# Patient Record
Sex: Female | Born: 1970
Health system: Southern US, Community
[De-identification: ages and names within clinical notes are randomized; demographics above are authoritative.]

## PROBLEM LIST (undated history)

## (undated) DIAGNOSIS — T7840XA Allergy, unspecified, initial encounter: Secondary | ICD-10-CM

## (undated) DIAGNOSIS — T4145XA Adverse effect of unspecified anesthetic, initial encounter: Secondary | ICD-10-CM

## (undated) DIAGNOSIS — D6851 Activated protein C resistance: Secondary | ICD-10-CM

## (undated) DIAGNOSIS — F419 Anxiety disorder, unspecified: Secondary | ICD-10-CM

## (undated) DIAGNOSIS — R0602 Shortness of breath: Secondary | ICD-10-CM

## (undated) DIAGNOSIS — R21 Rash and other nonspecific skin eruption: Secondary | ICD-10-CM

## (undated) DIAGNOSIS — N390 Urinary tract infection, site not specified: Secondary | ICD-10-CM

## (undated) DIAGNOSIS — N189 Chronic kidney disease, unspecified: Secondary | ICD-10-CM

## (undated) HISTORY — PX: OTHER SURGICAL HISTORY: SHX169

## (undated) HISTORY — DX: Allergy, unspecified, initial encounter: T78.40XA

## (undated) HISTORY — DX: Activated protein C resistance: D68.51

## (undated) HISTORY — PX: TUBAL LIGATION: SHX77

## (undated) HISTORY — PX: LITHOTRIPSY: SUR834

---

## 1997-12-01 ENCOUNTER — Observation Stay (HOSPITAL_COMMUNITY): Admission: EM | Admit: 1997-12-01 | Discharge: 1997-12-02 | Payer: Self-pay | Admitting: Emergency Medicine

## 1998-03-25 ENCOUNTER — Other Ambulatory Visit: Admission: RE | Admit: 1998-03-25 | Discharge: 1998-03-25 | Payer: Self-pay

## 1998-07-05 ENCOUNTER — Ambulatory Visit (HOSPITAL_COMMUNITY): Admission: RE | Admit: 1998-07-05 | Discharge: 1998-07-05 | Payer: Self-pay | Admitting: *Deleted

## 1998-07-05 ENCOUNTER — Encounter: Payer: Self-pay | Admitting: *Deleted

## 1998-07-07 ENCOUNTER — Encounter: Payer: Self-pay | Admitting: *Deleted

## 1998-07-07 ENCOUNTER — Ambulatory Visit (HOSPITAL_COMMUNITY): Admission: RE | Admit: 1998-07-07 | Discharge: 1998-07-07 | Payer: Self-pay | Admitting: *Deleted

## 1998-08-08 ENCOUNTER — Encounter: Payer: Self-pay | Admitting: Family Medicine

## 1998-08-08 ENCOUNTER — Encounter: Payer: Self-pay | Admitting: *Deleted

## 1998-08-08 ENCOUNTER — Observation Stay (HOSPITAL_COMMUNITY): Admission: AD | Admit: 1998-08-08 | Discharge: 1998-08-09 | Payer: Self-pay | Admitting: *Deleted

## 1998-08-10 ENCOUNTER — Inpatient Hospital Stay (HOSPITAL_COMMUNITY): Admission: AD | Admit: 1998-08-10 | Discharge: 1998-08-10 | Payer: Self-pay | Admitting: *Deleted

## 1998-08-15 ENCOUNTER — Encounter: Payer: Self-pay | Admitting: Family Medicine

## 1998-08-15 ENCOUNTER — Inpatient Hospital Stay (HOSPITAL_COMMUNITY): Admission: AD | Admit: 1998-08-15 | Discharge: 1998-08-19 | Payer: Self-pay | Admitting: Family Medicine

## 1998-08-16 ENCOUNTER — Encounter: Payer: Self-pay | Admitting: Family Medicine

## 1998-08-16 ENCOUNTER — Encounter: Payer: Self-pay | Admitting: *Deleted

## 1998-08-18 ENCOUNTER — Encounter: Payer: Self-pay | Admitting: Obstetrics & Gynecology

## 1998-08-18 ENCOUNTER — Encounter: Payer: Self-pay | Admitting: Obstetrics

## 1998-08-19 ENCOUNTER — Encounter: Payer: Self-pay | Admitting: Obstetrics

## 1998-11-08 ENCOUNTER — Ambulatory Visit (HOSPITAL_COMMUNITY): Admission: RE | Admit: 1998-11-08 | Discharge: 1998-11-08 | Payer: Self-pay | Admitting: Urology

## 1998-11-08 ENCOUNTER — Encounter: Payer: Self-pay | Admitting: Urology

## 1998-11-12 ENCOUNTER — Inpatient Hospital Stay (HOSPITAL_COMMUNITY): Admission: EM | Admit: 1998-11-12 | Discharge: 1998-11-13 | Payer: Self-pay | Admitting: *Deleted

## 1998-11-21 ENCOUNTER — Encounter: Payer: Self-pay | Admitting: Urology

## 1998-11-21 ENCOUNTER — Ambulatory Visit (HOSPITAL_COMMUNITY): Admission: RE | Admit: 1998-11-21 | Discharge: 1998-11-21 | Payer: Self-pay | Admitting: Urology

## 1998-12-26 ENCOUNTER — Other Ambulatory Visit: Admission: RE | Admit: 1998-12-26 | Discharge: 1998-12-26 | Payer: Self-pay | Admitting: Family Medicine

## 1999-02-23 ENCOUNTER — Other Ambulatory Visit: Admission: RE | Admit: 1999-02-23 | Discharge: 1999-02-23 | Payer: Self-pay | Admitting: Obstetrics and Gynecology

## 1999-08-02 ENCOUNTER — Other Ambulatory Visit: Admission: RE | Admit: 1999-08-02 | Discharge: 1999-08-02 | Payer: Self-pay | Admitting: Family Medicine

## 1999-12-29 ENCOUNTER — Encounter: Payer: Self-pay | Admitting: Family Medicine

## 1999-12-29 ENCOUNTER — Ambulatory Visit (HOSPITAL_COMMUNITY): Admission: RE | Admit: 1999-12-29 | Discharge: 1999-12-29 | Payer: Self-pay | Admitting: Family Medicine

## 2000-01-29 ENCOUNTER — Other Ambulatory Visit: Admission: RE | Admit: 2000-01-29 | Discharge: 2000-01-29 | Payer: Self-pay | Admitting: Gynecology

## 2000-03-19 ENCOUNTER — Inpatient Hospital Stay (HOSPITAL_COMMUNITY): Admission: AD | Admit: 2000-03-19 | Discharge: 2000-03-19 | Payer: Self-pay | Admitting: *Deleted

## 2000-05-07 ENCOUNTER — Emergency Department (HOSPITAL_COMMUNITY): Admission: EM | Admit: 2000-05-07 | Discharge: 2000-05-08 | Payer: Self-pay | Admitting: Emergency Medicine

## 2000-05-08 ENCOUNTER — Encounter: Payer: Self-pay | Admitting: Urology

## 2000-05-08 ENCOUNTER — Encounter: Admission: RE | Admit: 2000-05-08 | Discharge: 2000-05-08 | Payer: Self-pay | Admitting: Obstetrics and Gynecology

## 2000-07-29 ENCOUNTER — Inpatient Hospital Stay (HOSPITAL_COMMUNITY): Admission: AD | Admit: 2000-07-29 | Discharge: 2000-08-01 | Payer: Self-pay | Admitting: Gynecology

## 2000-09-09 ENCOUNTER — Other Ambulatory Visit: Admission: RE | Admit: 2000-09-09 | Discharge: 2000-09-09 | Payer: Self-pay | Admitting: Gynecology

## 2001-10-29 ENCOUNTER — Other Ambulatory Visit: Admission: RE | Admit: 2001-10-29 | Discharge: 2001-10-29 | Payer: Self-pay | Admitting: Family Medicine

## 2002-12-31 ENCOUNTER — Other Ambulatory Visit: Admission: RE | Admit: 2002-12-31 | Discharge: 2002-12-31 | Payer: Self-pay | Admitting: Family Medicine

## 2003-01-11 ENCOUNTER — Encounter: Admission: RE | Admit: 2003-01-11 | Discharge: 2003-01-11 | Payer: Self-pay | Admitting: Family Medicine

## 2004-03-16 ENCOUNTER — Other Ambulatory Visit: Admission: RE | Admit: 2004-03-16 | Discharge: 2004-03-16 | Payer: Self-pay | Admitting: Family Medicine

## 2005-06-27 ENCOUNTER — Encounter: Payer: Self-pay | Admitting: *Deleted

## 2005-06-27 ENCOUNTER — Encounter: Payer: Self-pay | Admitting: Urology

## 2005-06-27 ENCOUNTER — Encounter: Payer: Self-pay | Admitting: Emergency Medicine

## 2005-07-19 ENCOUNTER — Other Ambulatory Visit: Admission: RE | Admit: 2005-07-19 | Discharge: 2005-07-19 | Payer: Self-pay | Admitting: Family Medicine

## 2010-08-08 ENCOUNTER — Inpatient Hospital Stay (HOSPITAL_COMMUNITY)
Admission: AD | Admit: 2010-08-08 | Discharge: 2010-08-08 | Disposition: A | Discharge status: Home / Self Care | Payer: PRIVATE HEALTH INSURANCE | Source: Ambulatory Visit | Attending: Obstetrics & Gynecology | Admitting: Obstetrics & Gynecology

## 2010-08-08 DIAGNOSIS — O9989 Other specified diseases and conditions complicating pregnancy, childbirth and the puerperium: Secondary | ICD-10-CM

## 2010-08-08 DIAGNOSIS — R300 Dysuria: Secondary | ICD-10-CM | POA: Insufficient documentation

## 2010-08-08 DIAGNOSIS — O99891 Other specified diseases and conditions complicating pregnancy: Secondary | ICD-10-CM | POA: Insufficient documentation

## 2010-08-08 LAB — URINE MICROSCOPIC-ADD ON

## 2010-08-08 LAB — URINALYSIS, ROUTINE W REFLEX MICROSCOPIC
Bilirubin Urine: NEGATIVE
Glucose, UA: NEGATIVE mg/dL
Ketones, ur: NEGATIVE mg/dL
Nitrite: NEGATIVE
Protein, ur: NEGATIVE mg/dL
Specific Gravity, Urine: >1.03 — ABNORMAL HIGH (ref 1.005–1.030)
Urobilinogen, UA: 0.2 mg/dL (ref 0.0–1.0)
pH: 5.5 (ref 5.0–8.0)

## 2010-08-08 LAB — POCT PREGNANCY, URINE: Preg Test, Ur: POSITIVE

## 2010-08-10 LAB — URINE CULTURE
Colony Count: >=100000
Culture  Setup Time: 201206130516

## 2010-08-14 LAB — ANTIBODY SCREEN: Antibody Screen: NEGATIVE

## 2010-08-14 LAB — GC/CHLAMYDIA PROBE AMP, GENITAL
Chlamydia: NEGATIVE
Gonorrhea: NEGATIVE

## 2010-08-14 LAB — RUBELLA ANTIBODY, IGM: Rubella: IMMUNE

## 2010-08-14 LAB — ABO/RH: RH Type: POSITIVE

## 2010-08-14 LAB — HIV ANTIBODY (ROUTINE TESTING W REFLEX): HIV: NONREACTIVE

## 2010-08-14 LAB — RPR: RPR: NONREACTIVE

## 2010-08-14 LAB — HEPATITIS B SURFACE ANTIGEN: Hepatitis B Surface Ag: NEGATIVE

## 2011-01-15 ENCOUNTER — Encounter (HOSPITAL_COMMUNITY): Payer: Self-pay | Admitting: *Deleted

## 2011-01-15 ENCOUNTER — Inpatient Hospital Stay (HOSPITAL_COMMUNITY)
Admission: AD | Admit: 2011-01-15 | Discharge: 2011-01-15 | Disposition: A | Discharge status: Home / Self Care | Payer: PRIVATE HEALTH INSURANCE | Source: Ambulatory Visit | Attending: Obstetrics and Gynecology | Admitting: Obstetrics and Gynecology

## 2011-01-15 DIAGNOSIS — O99891 Other specified diseases and conditions complicating pregnancy: Secondary | ICD-10-CM | POA: Insufficient documentation

## 2011-01-15 DIAGNOSIS — R0602 Shortness of breath: Secondary | ICD-10-CM

## 2011-01-15 DIAGNOSIS — Z3480 Encounter for supervision of other normal pregnancy, unspecified trimester: Secondary | ICD-10-CM

## 2011-01-15 HISTORY — DX: Chronic kidney disease, unspecified: N18.9

## 2011-01-15 HISTORY — DX: Shortness of breath: R06.02

## 2011-01-15 NOTE — Progress Notes (Signed)
Pt c/o SOB, took percocet @ 1930 and was SOB @ 2030.  Pt not in any resp distress, Resp 17 and even.

## 2011-01-15 NOTE — ED Provider Notes (Signed)
History   Pt presents today c/o SOB when she is supine. She states she has to stand up to "catch a deep breath." She denies CP, fever, abd pain, vag dc, bleeding, or any other sx at this time. She reports GFM.  Chief Complaint  Patient presents with  . Shortness of Breath   HPI  OB History    Grav Para Term Preterm Abortions TAB SAB Ect Mult Living   3 2 1 1      2       Past Medical History  Diagnosis Date  . Chronic kidney disease   . Shortness of breath   . Depression     Past Surgical History  Procedure Date  . Cesarean section   . Colapsed lung   . Kidney stint   . Lithotripsy     Family History  Problem Relation Age of Onset  . Hypertension Mother   . Arthritis Father   . Diabetes Father   . Hypertension Maternal Grandmother     History  Substance Use Topics  . Smoking status: Never Smoker   . Smokeless tobacco: Not on file  . Alcohol Use: No    Allergies:  Allergies  Allergen Reactions  . Demerol Itching    Prescriptions prior to admission  Medication Sig Dispense Refill  . cyclobenzaprine (FLEXERIL) 5 MG tablet Take 5 mg by mouth 3 (three) times daily as needed. For muscle pain       . diphenhydrAMINE (BENADRYL) 25 MG tablet Take 25 mg by mouth 2 (two) times daily as needed. For congestion or allergies       . nitrofurantoin (MACRODANTIN) 50 MG capsule Take 50 mg by mouth daily.        Marland Kitchen oxyCODONE-acetaminophen (TYLOX) 5-500 MG per capsule Take 1 capsule by mouth every 4 (four) hours as needed. For pain       . prenatal vitamin w/FE, FA (PRENATAL 1 + 1) 27-1 MG TABS Take 1 tablet by mouth daily.          Review of Systems  Constitutional: Negative for fever.  Respiratory: Positive for shortness of breath. Negative for sputum production and wheezing.   Cardiovascular: Negative for chest pain.  Gastrointestinal: Negative for nausea, vomiting, abdominal pain, diarrhea and constipation.  Genitourinary: Negative for dysuria, urgency, frequency,  hematuria and flank pain.  Neurological: Negative for dizziness and headaches.  Psychiatric/Behavioral: Negative for depression and suicidal ideas.   Physical Exam   Blood pressure 121/65, pulse 93, temperature 98.4 F (36.9 C), temperature source Oral, resp. rate 16, height 5\' 7"  (1.702 m), weight 179 lb (81.194 kg), SpO2 99.00%.  Physical Exam  Nursing note and vitals reviewed. Constitutional: She is oriented to person, place, and time. She appears well-developed and well-nourished. No distress.  HENT:  Head: Normocephalic and atraumatic.  Eyes: EOM are normal. Pupils are equal, round, and reactive to light.  Cardiovascular: Normal rate and regular rhythm.  Exam reveals no gallop and no friction rub.   Murmur heard. Respiratory: Effort normal and breath sounds normal. No respiratory distress. She has no wheezes. She has no rales. She exhibits no tenderness.  GI: Soft. She exhibits no distension. There is no tenderness. There is no rebound and no guarding.  Neurological: She is alert and oriented to person, place, and time.  Skin: Skin is warm and dry. She is not diaphoretic.  Psychiatric: She has a normal mood and affect. Her behavior is normal. Judgment and thought content normal.  MAU Course  Procedures  O2 sat 99% on room air.  Discussed pt with Dr. Vincente Poli.  Assessment and Plan  SOB: discussed with pt at length. Advised pt to sleep at an incline. Discussed diet, activity, risks, and precautions. Reminded of [facility].  Clinton Gallant. Rice III, DrHSc, MPAS, PA-C  01/15/2011, 10:48 PM   Henrietta Hoover, PA 01/15/11 2252

## 2011-01-15 NOTE — Progress Notes (Signed)
Pt states she is short of breath when she lies down and has to stand up to take a deep breath--started at 2030-note she was seen today for a pinched nerve in her neck and given a script for percocet-seh took percocet at 1930 and the SOB started 1 hour later.

## 2011-02-16 ENCOUNTER — Encounter (HOSPITAL_COMMUNITY): Payer: Self-pay | Admitting: Pharmacist

## 2011-02-26 NOTE — H&P (Signed)
Valerie Cole  DICTATION # [id number] CSN# [id number]   Meriel Pica, MD 02/26/2011 10:13 AM

## 2011-02-28 NOTE — H&P (Signed)
NAMENORETTA, FRIER                 ACCOUNT NO.:  [id number]  MEDICAL RECORD NO.:  [id number]  LOCATION:                                 FACILITY:  PHYSICIAN:  Duke Salvia. Marcelle Overlie, M.D.DATE OF BIRTH:  January 27, 1971  DATE OF ADMISSION:  03/09/2011 DATE OF DISCHARGE:                             HISTORY & PHYSICAL   CHIEF COMPLAINT:  For repeat cesarean section at term.  HISTORY OF PRESENT ILLNESS:  A 41 year old, G3, P1-1-0-2, EDD January 18, presents for repeat cesarean section and tubal ligation.  This procedure including risks related to bleeding, infection, transfusion, adjacent organ injury, wound infection, phlebitis along with her expected recovery time, all reviewed with her which she understands and accepts.  In addition, the permanence of the tubal procedure failure rate of 2-3 per 1000 discussed which she also accepts.  This patient has had 2 previous cesarean section.  Has a child with spina bifida, early in the pregnancy. She has been on extra folic acid, seen Dr. Vonita Moss also for a history of renal stones.  Her first trimester screen AFP only were normal along with screening ultrasound normal.  She has been on extra iron.  She has also seen [facility] for a neck pain and has been diagnosed with cervical radiculopathy taking steroid dose pack x2 for relief along with Percocet p.r.n.  GBS was negative.  Last ultrasound February 22, 2011, EFW 6 pounds 3 ounces. AFI was normal, BPP 8/8.  PAST MEDICAL HISTORY:  Please see the [facility] form for details.  PHYSICAL EXAMINATION:  VITAL SIGNS:  Temp 98.2, blood pressure 130/84. HEENT:  Unremarkable. NECK:  Supple without masses. LUNGS:  Clear. CARDIOVASCULAR:  Regular rate and rhythm without murmurs, rubs or gallops. BREASTS:  Without masses.  Term fundal height.  The heart rate 140. Cervix was closed. EXTREMITIES:  Unremarkable. NEUROLOGIC:  Unremarkable.  IMPRESSION:  Term pregnancy, previous cesarean section x2.  PLAN:   Repeat cesarean section, tubal ligation.  Procedure and risks discussed as above.     Murriel Holwerda M. Marcelle Overlie, M.D.    RMH/MEDQ  D:  02/26/2011  T:  02/27/2011  Job:  [id number]

## 2011-03-01 ENCOUNTER — Encounter (HOSPITAL_COMMUNITY): Payer: Self-pay | Admitting: *Deleted

## 2011-03-01 NOTE — Progress Notes (Signed)
The date of RCS was moved up several days secondary to severe PUPPP rash, not responding to PO Medrol, AND severe kidney stone pain requiring PO Percocet.

## 2011-03-02 ENCOUNTER — Encounter (HOSPITAL_COMMUNITY): Payer: Self-pay

## 2011-03-02 ENCOUNTER — Encounter (HOSPITAL_COMMUNITY)
Admission: RE | Admit: 2011-03-02 | Discharge: 2011-03-02 | Disposition: A | Discharge status: Home / Self Care | Payer: PRIVATE HEALTH INSURANCE | Source: Ambulatory Visit | Attending: Obstetrics and Gynecology | Admitting: Obstetrics and Gynecology

## 2011-03-02 HISTORY — DX: Anxiety disorder, unspecified: F41.9

## 2011-03-02 HISTORY — DX: Urinary tract infection, site not specified: N39.0

## 2011-03-02 HISTORY — DX: Adverse effect of unspecified anesthetic, initial encounter: T41.45XA

## 2011-03-02 HISTORY — DX: Rash and other nonspecific skin eruption: R21

## 2011-03-02 LAB — CBC
HCT: 30.2 % — ABNORMAL LOW (ref 36.0–46.0)
Hemoglobin: 8.9 g/dL — ABNORMAL LOW (ref 12.0–15.0)
MCH: 23.9 pg — ABNORMAL LOW (ref 26.0–34.0)
MCHC: 29.5 g/dL — ABNORMAL LOW (ref 30.0–36.0)
MCV: 81.2 fL (ref 78.0–100.0)
Platelets: 208 10*3/uL (ref 150–400)
RBC: 3.72 MIL/uL — ABNORMAL LOW (ref 3.87–5.11)
RDW: 17.5 % — ABNORMAL HIGH (ref 11.5–15.5)
WBC: 10.5 10*3/uL (ref 4.0–10.5)

## 2011-03-02 LAB — SURGICAL PCR SCREEN
MRSA, PCR: NEGATIVE
Staphylococcus aureus: NEGATIVE

## 2011-03-02 LAB — RPR: RPR Ser Ql: NONREACTIVE

## 2011-03-02 NOTE — Patient Instructions (Addendum)
YOUR PROCEDURE IS SCHEDULED ON:03/06/11  ENTER THROUGH THE MAIN ENTRANCE OF [facility] AT:6am USE DESK PHONE AND DIAL [phone] TO INFORM us OF YOUR ARRIVAL  CALL [phone] IF YOU HAVE ANY QUESTIONS OR PROBLEMS PRIOR TO YOUR ARRIVAL.  REMEMBER: DO NOT EAT OR DRINK AFTER MIDNIGHT :Monday  SPECIAL INSTRUCTIONS:water is ok until 3:30 am  YOU MAY BRUSH YOUR TEETH THE MORNING OF SURGERY   TAKE THESE MEDICINES THE DAY OF SURGERY WITH SIP OF WATER:none   DO NOT WEAR JEWELRY, EYE MAKEUP, LIPSTICK OR DARK FINGERNAIL POLISH DO NOT WEAR LOTIONS OR DEODORANT DO NOT SHAVE FOR 48 HOURS PRIOR TO SURGERY  YOU WILL NOT BE ALLOWED TO DRIVE YOURSELF HOME.  NAME OF DRIVER: Lisbeth Ply

## 2011-03-06 ENCOUNTER — Encounter (HOSPITAL_COMMUNITY): Payer: Self-pay | Admitting: Anesthesiology

## 2011-03-06 ENCOUNTER — Encounter (HOSPITAL_COMMUNITY)
Admission: RE | Disposition: A | Discharge status: Home / Self Care | Payer: Self-pay | Source: Ambulatory Visit | Attending: Obstetrics and Gynecology

## 2011-03-06 ENCOUNTER — Encounter (HOSPITAL_COMMUNITY): Payer: Self-pay | Admitting: *Deleted

## 2011-03-06 ENCOUNTER — Inpatient Hospital Stay (HOSPITAL_COMMUNITY): Payer: PRIVATE HEALTH INSURANCE | Admitting: Anesthesiology

## 2011-03-06 ENCOUNTER — Inpatient Hospital Stay (HOSPITAL_COMMUNITY)
Admission: RE | Admit: 2011-03-06 | Discharge: 2011-03-09 | DRG: 766 | Disposition: A | Discharge status: Home / Self Care | Payer: PRIVATE HEALTH INSURANCE | Source: Ambulatory Visit | Attending: Obstetrics and Gynecology | Admitting: Obstetrics and Gynecology

## 2011-03-06 DIAGNOSIS — Z01812 Encounter for preprocedural laboratory examination: Secondary | ICD-10-CM

## 2011-03-06 DIAGNOSIS — Z302 Encounter for sterilization: Secondary | ICD-10-CM

## 2011-03-06 DIAGNOSIS — O34219 Maternal care for unspecified type scar from previous cesarean delivery: Principal | ICD-10-CM | POA: Diagnosis present

## 2011-03-06 DIAGNOSIS — O26899 Other specified pregnancy related conditions, unspecified trimester: Secondary | ICD-10-CM | POA: Diagnosis present

## 2011-03-06 DIAGNOSIS — Z01818 Encounter for other preprocedural examination: Secondary | ICD-10-CM

## 2011-03-06 DIAGNOSIS — O09529 Supervision of elderly multigravida, unspecified trimester: Secondary | ICD-10-CM | POA: Diagnosis present

## 2011-03-06 DIAGNOSIS — L299 Pruritus, unspecified: Secondary | ICD-10-CM | POA: Diagnosis present

## 2011-03-06 LAB — ABO/RH: ABO/RH(D): O POS

## 2011-03-06 LAB — TYPE AND SCREEN
ABO/RH(D): O POS
Antibody Screen: NEGATIVE

## 2011-03-06 SURGERY — Surgical Case
Anesthesia: Epidural | Site: Abdomen | Wound class: Clean Contaminated

## 2011-03-06 MED ORDER — SODIUM CHLORIDE 0.9 % IJ SOLN: 3.0000 mL | INTRAMUSCULAR | Status: DC | PRN

## 2011-03-06 MED ORDER — OXYTOCIN 20 UNITS IN LACTATED RINGERS INFUSION - SIMPLE: 125.0000 mL/h | INTRAVENOUS | Status: AC

## 2011-03-06 MED ORDER — PHENYLEPHRINE HCL 10 MG/ML IJ SOLN
INTRAMUSCULAR | Status: DC | PRN
  Administered 2011-03-06: 40 ug via INTRAVENOUS
  Administered 2011-03-06: 80 ug via INTRAVENOUS
  Administered 2011-03-06 (×3): 40 ug via INTRAVENOUS

## 2011-03-06 MED ORDER — KETOROLAC TROMETHAMINE 60 MG/2ML IM SOLN
60.0000 mg | Freq: Once | INTRAMUSCULAR | Status: AC | PRN
  Administered 2011-03-06: 60 mg via INTRAMUSCULAR

## 2011-03-06 MED ORDER — DIPHENHYDRAMINE HCL 50 MG/ML IJ SOLN
INTRAMUSCULAR | Status: DC | PRN
  Administered 2011-03-06: 25 mg via INTRAVENOUS

## 2011-03-06 MED ORDER — MORPHINE SULFATE 0.5 MG/ML IJ SOLN
INTRAMUSCULAR | Status: AC
  Filled 2011-03-06: qty 10

## 2011-03-06 MED ORDER — FENTANYL CITRATE 0.05 MG/ML IJ SOLN: 25.0000 ug | INTRAMUSCULAR | Status: DC | PRN

## 2011-03-06 MED ORDER — NALOXONE HCL 0.4 MG/ML IJ SOLN: 0.4000 mg | INTRAMUSCULAR | Status: DC | PRN

## 2011-03-06 MED ORDER — EPHEDRINE SULFATE 50 MG/ML IJ SOLN
INTRAMUSCULAR | Status: DC | PRN
  Administered 2011-03-06: 5 mg via INTRAVENOUS
  Administered 2011-03-06 (×2): 10 mg via INTRAVENOUS
  Administered 2011-03-06: 5 mg via INTRAVENOUS

## 2011-03-06 MED ORDER — SODIUM CHLORIDE 0.9 % IV SOLN: 250.0000 mL | INTRAVENOUS | Status: DC | PRN

## 2011-03-06 MED ORDER — MEASLES, MUMPS & RUBELLA VAC ~~LOC~~ INJ
0.5000 mL | INJECTION | Freq: Once | SUBCUTANEOUS | Status: DC
  Filled 2011-03-06: qty 0.5

## 2011-03-06 MED ORDER — OXYTOCIN 10 UNIT/ML IJ SOLN
INTRAMUSCULAR | Status: AC
  Filled 2011-03-06: qty 2

## 2011-03-06 MED ORDER — WITCH HAZEL-GLYCERIN EX PADS: 1.0000 "application " | MEDICATED_PAD | CUTANEOUS | Status: DC | PRN

## 2011-03-06 MED ORDER — HYDROMORPHONE HCL PF 1 MG/ML IJ SOLN
INTRAMUSCULAR | Status: AC
  Filled 2011-03-06: qty 1

## 2011-03-06 MED ORDER — SCOPOLAMINE 1 MG/3DAYS TD PT72
MEDICATED_PATCH | TRANSDERMAL | Status: AC
  Administered 2011-03-06: 1.5 mg via TRANSDERMAL
  Filled 2011-03-06: qty 1

## 2011-03-06 MED ORDER — SENNOSIDES-DOCUSATE SODIUM 8.6-50 MG PO TABS
2.0000 | ORAL_TABLET | Freq: Every day | ORAL | Status: DC
  Administered 2011-03-06 – 2011-03-08 (×3): 2 via ORAL

## 2011-03-06 MED ORDER — MORPHINE SULFATE (PF) 0.5 MG/ML IJ SOLN
INTRAMUSCULAR | Status: DC | PRN
  Administered 2011-03-06: 150 ug via INTRATHECAL

## 2011-03-06 MED ORDER — LACTATED RINGERS IV SOLN
INTRAVENOUS | Status: DC
  Administered 2011-03-06: 100 mL/h via INTRAVENOUS
  Administered 2011-03-06 (×4): via INTRAVENOUS

## 2011-03-06 MED ORDER — PRENATAL MULTIVITAMIN CH
1.0000 | ORAL_TABLET | Freq: Every day | ORAL | Status: DC
  Administered 2011-03-07 – 2011-03-09 (×3): 1 via ORAL
  Filled 2011-03-06 (×3): qty 1

## 2011-03-06 MED ORDER — SCOPOLAMINE 1 MG/3DAYS TD PT72
1.0000 | MEDICATED_PATCH | Freq: Once | TRANSDERMAL | Status: DC
  Administered 2011-03-06: 1.5 mg via TRANSDERMAL

## 2011-03-06 MED ORDER — CEFAZOLIN SODIUM 1-5 GM-% IV SOLN
1.0000 g | INTRAVENOUS | Status: AC
  Administered 2011-03-06: 1 g via INTRAVENOUS

## 2011-03-06 MED ORDER — METOCLOPRAMIDE HCL 5 MG/ML IJ SOLN
INTRAMUSCULAR | Status: AC
  Administered 2011-03-06: 10 mg via INTRAVENOUS
  Filled 2011-03-06: qty 2

## 2011-03-06 MED ORDER — DIBUCAINE 1 % RE OINT: 1.0000 "application " | TOPICAL_OINTMENT | RECTAL | Status: DC | PRN

## 2011-03-06 MED ORDER — ATROPINE SULFATE 0.4 MG/ML IJ SOLN
INTRAMUSCULAR | Status: DC | PRN
  Administered 2011-03-06: 0.4 mg via INTRAVENOUS

## 2011-03-06 MED ORDER — ZOLPIDEM TARTRATE 5 MG PO TABS: 5.0000 mg | ORAL_TABLET | Freq: Every evening | ORAL | Status: DC | PRN

## 2011-03-06 MED ORDER — PHENYLEPHRINE 40 MCG/ML (10ML) SYRINGE FOR IV PUSH (FOR BLOOD PRESSURE SUPPORT)
PREFILLED_SYRINGE | INTRAVENOUS | Status: AC
  Filled 2011-03-06: qty 5

## 2011-03-06 MED ORDER — DIPHENHYDRAMINE HCL 25 MG PO CAPS: 25.0000 mg | ORAL_CAPSULE | Freq: Four times a day (QID) | ORAL | Status: DC | PRN

## 2011-03-06 MED ORDER — FENTANYL CITRATE 0.05 MG/ML IJ SOLN
INTRAMUSCULAR | Status: DC | PRN
  Administered 2011-03-06: 75 ug via INTRAVENOUS
  Administered 2011-03-06: 25 ug via INTRATHECAL

## 2011-03-06 MED ORDER — SODIUM CHLORIDE 0.9 % IJ SOLN: 3.0000 mL | Freq: Two times a day (BID) | INTRAMUSCULAR | Status: DC

## 2011-03-06 MED ORDER — KETOROLAC TROMETHAMINE 60 MG/2ML IM SOLN
INTRAMUSCULAR | Status: AC
  Administered 2011-03-06: 60 mg via INTRAMUSCULAR
  Filled 2011-03-06: qty 2

## 2011-03-06 MED ORDER — LANOLIN HYDROUS EX OINT: 1.0000 "application " | TOPICAL_OINTMENT | CUTANEOUS | Status: DC | PRN

## 2011-03-06 MED ORDER — SIMETHICONE 80 MG PO CHEW
80.0000 mg | CHEWABLE_TABLET | Freq: Three times a day (TID) | ORAL | Status: DC
  Administered 2011-03-06 – 2011-03-09 (×10): 80 mg via ORAL

## 2011-03-06 MED ORDER — DIPHENHYDRAMINE HCL 25 MG PO CAPS
25.0000 mg | ORAL_CAPSULE | ORAL | Status: DC | PRN
  Administered 2011-03-06: 25 mg via ORAL
  Filled 2011-03-06 (×2): qty 1

## 2011-03-06 MED ORDER — NALBUPHINE HCL 10 MG/ML IJ SOLN
5.0000 mg | INTRAMUSCULAR | Status: DC | PRN
  Administered 2011-03-06: 10 mg via SUBCUTANEOUS
  Administered 2011-03-06: 5 mg via SUBCUTANEOUS
  Filled 2011-03-06 (×4): qty 1

## 2011-03-06 MED ORDER — ONDANSETRON HCL 4 MG/2ML IJ SOLN
INTRAMUSCULAR | Status: AC
  Filled 2011-03-06: qty 2

## 2011-03-06 MED ORDER — HYDROMORPHONE HCL PF 1 MG/ML IJ SOLN
1.0000 mg | Freq: Once | INTRAMUSCULAR | Status: AC
  Administered 2011-03-06: 1 mg via INTRAVENOUS

## 2011-03-06 MED ORDER — TETANUS-DIPHTH-ACELL PERTUSSIS 5-2.5-18.5 LF-MCG/0.5 IM SUSP: 0.5000 mL | Freq: Once | INTRAMUSCULAR | Status: DC

## 2011-03-06 MED ORDER — SCOPOLAMINE 1 MG/3DAYS TD PT72
1.0000 | MEDICATED_PATCH | Freq: Once | TRANSDERMAL | Status: DC
  Filled 2011-03-06: qty 1

## 2011-03-06 MED ORDER — KETOROLAC TROMETHAMINE 30 MG/ML IJ SOLN: 30.0000 mg | Freq: Four times a day (QID) | INTRAMUSCULAR | Status: AC | PRN

## 2011-03-06 MED ORDER — DIPHENHYDRAMINE HCL 50 MG/ML IJ SOLN: 12.5000 mg | INTRAMUSCULAR | Status: DC | PRN

## 2011-03-06 MED ORDER — DIPHENHYDRAMINE HCL 50 MG/ML IJ SOLN
INTRAMUSCULAR | Status: AC
  Administered 2011-03-06: 25 mg via INTRAMUSCULAR
  Filled 2011-03-06: qty 1

## 2011-03-06 MED ORDER — OXYTOCIN 20 UNITS IN LACTATED RINGERS INFUSION - SIMPLE
INTRAVENOUS | Status: DC | PRN
  Administered 2011-03-06: 20 [IU] via INTRAVENOUS

## 2011-03-06 MED ORDER — FLEET ENEMA 7-19 GM/118ML RE ENEM: 1.0000 | ENEMA | Freq: Every day | RECTAL | Status: DC | PRN

## 2011-03-06 MED ORDER — IBUPROFEN 800 MG PO TABS
800.0000 mg | ORAL_TABLET | Freq: Three times a day (TID) | ORAL | Status: DC | PRN
  Administered 2011-03-08: 800 mg via ORAL
  Administered 2011-03-08: 600 mg via ORAL
  Administered 2011-03-08 – 2011-03-09 (×2): 800 mg via ORAL
  Filled 2011-03-06 (×3): qty 1

## 2011-03-06 MED ORDER — NALOXONE HCL 0.4 MG/ML IJ SOLN
1.0000 ug/kg/h | INTRAMUSCULAR | Status: DC | PRN
  Filled 2011-03-06: qty 2.5

## 2011-03-06 MED ORDER — IBUPROFEN 600 MG PO TABS
600.0000 mg | ORAL_TABLET | Freq: Four times a day (QID) | ORAL | Status: DC | PRN
  Administered 2011-03-07 (×2): 600 mg via ORAL
  Filled 2011-03-06 (×4): qty 1

## 2011-03-06 MED ORDER — EPHEDRINE 5 MG/ML INJ
INTRAVENOUS | Status: AC
  Filled 2011-03-06: qty 10

## 2011-03-06 MED ORDER — ATROPINE SULFATE 0.4 MG/ML IJ SOLN
INTRAMUSCULAR | Status: AC
  Filled 2011-03-06: qty 1

## 2011-03-06 MED ORDER — BUPIVACAINE IN DEXTROSE 0.75-8.25 % IT SOLN
INTRATHECAL | Status: DC | PRN
  Administered 2011-03-06: 13 mg via INTRATHECAL

## 2011-03-06 MED ORDER — FENTANYL CITRATE 0.05 MG/ML IJ SOLN
INTRAMUSCULAR | Status: AC
  Filled 2011-03-06: qty 2

## 2011-03-06 MED ORDER — SIMETHICONE 80 MG PO CHEW: 80.0000 mg | CHEWABLE_TABLET | ORAL | Status: DC | PRN

## 2011-03-06 MED ORDER — ONDANSETRON HCL 4 MG/2ML IJ SOLN: 4.0000 mg | Freq: Three times a day (TID) | INTRAMUSCULAR | Status: DC | PRN

## 2011-03-06 MED ORDER — KETOROLAC TROMETHAMINE 30 MG/ML IJ SOLN
30.0000 mg | Freq: Four times a day (QID) | INTRAMUSCULAR | Status: AC | PRN
  Administered 2011-03-06: 30 mg via INTRAVENOUS
  Filled 2011-03-06: qty 1

## 2011-03-06 MED ORDER — METOCLOPRAMIDE HCL 5 MG/ML IJ SOLN
10.0000 mg | Freq: Three times a day (TID) | INTRAMUSCULAR | Status: DC | PRN
  Administered 2011-03-06: 10 mg via INTRAVENOUS

## 2011-03-06 MED ORDER — BISACODYL 10 MG RE SUPP
10.0000 mg | Freq: Every day | RECTAL | Status: DC | PRN
  Administered 2011-03-08: 10 mg via RECTAL
  Filled 2011-03-06: qty 1

## 2011-03-06 MED ORDER — MENTHOL 3 MG MT LOZG: 1.0000 | LOZENGE | OROMUCOSAL | Status: DC | PRN

## 2011-03-06 MED ORDER — NALBUPHINE HCL 10 MG/ML IJ SOLN
5.0000 mg | INTRAMUSCULAR | Status: DC | PRN
  Administered 2011-03-06: 5 mg via INTRAVENOUS
  Filled 2011-03-06: qty 1

## 2011-03-06 MED ORDER — DIPHENHYDRAMINE HCL 50 MG/ML IJ SOLN
25.0000 mg | INTRAMUSCULAR | Status: DC | PRN
  Administered 2011-03-06: 25 mg via INTRAMUSCULAR

## 2011-03-06 MED ORDER — ONDANSETRON HCL 4 MG/2ML IJ SOLN
INTRAMUSCULAR | Status: DC | PRN
  Administered 2011-03-06: 4 mg via INTRAVENOUS

## 2011-03-06 MED ORDER — OXYCODONE-ACETAMINOPHEN 5-325 MG PO TABS
1.0000 | ORAL_TABLET | Freq: Four times a day (QID) | ORAL | Status: DC | PRN
  Administered 2011-03-06: 1 via ORAL
  Administered 2011-03-07: 2 via ORAL
  Administered 2011-03-07 (×2): 1 via ORAL
  Administered 2011-03-07: 2 via ORAL
  Administered 2011-03-08 (×2): 1 via ORAL
  Administered 2011-03-08: 2 via ORAL
  Administered 2011-03-08 – 2011-03-09 (×5): 1 via ORAL
  Filled 2011-03-06 (×2): qty 1
  Filled 2011-03-06 (×2): qty 2
  Filled 2011-03-06 (×3): qty 1
  Filled 2011-03-06 (×4): qty 2
  Filled 2011-03-06 (×2): qty 1

## 2011-03-06 SURGICAL SUPPLY — 30 items
APL SKNCLS STERI-STRIP NONHPOA (GAUZE/BANDAGES/DRESSINGS) ×1
BENZOIN TINCTURE PRP APPL 2/3 (GAUZE/BANDAGES/DRESSINGS) ×1 IMPLANT
CLIP FILSHIE TUBAL LIGA STRL (Clip) ×1 IMPLANT
CLOSURE STERI STRIP 1/2 X4 (GAUZE/BANDAGES/DRESSINGS) ×1 IMPLANT
CLOTH BEACON ORANGE TIMEOUT ST (SAFETY) ×2 IMPLANT
DRESSING TELFA 8X3 (GAUZE/BANDAGES/DRESSINGS) ×2 IMPLANT
DRSG PAD ABDOMINAL 8X10 ST (GAUZE/BANDAGES/DRESSINGS) ×1 IMPLANT
ELECT REM PT RETURN 9FT ADLT (ELECTROSURGICAL) ×2
ELECTRODE REM PT RTRN 9FT ADLT (ELECTROSURGICAL) ×1 IMPLANT
EXTRACTOR VACUUM M CUP 4 TUBE (SUCTIONS) IMPLANT
GAUZE SPONGE 4X4 12PLY STRL LF (GAUZE/BANDAGES/DRESSINGS) ×4 IMPLANT
GLOVE BIO SURGEON STRL SZ7 (GLOVE) ×4 IMPLANT
GOWN PREVENTION PLUS LG XLONG (DISPOSABLE) ×6 IMPLANT
KIT ABG SYR 3ML LUER SLIP (SYRINGE) IMPLANT
NDL HYPO 25X5/8 SAFETYGLIDE (NEEDLE) ×1 IMPLANT
NEEDLE HYPO 25X5/8 SAFETYGLIDE (NEEDLE) ×2 IMPLANT
NS IRRIG 1000ML POUR BTL (IV SOLUTION) ×2 IMPLANT
PACK C SECTION WH (CUSTOM PROCEDURE TRAY) ×2 IMPLANT
PAD ABD 7.5X8 STRL (GAUZE/BANDAGES/DRESSINGS) ×1 IMPLANT
SLEEVE SCD COMPRESS KNEE MED (MISCELLANEOUS) ×1 IMPLANT
SPONGE GAUZE 4X4 12PLY (GAUZE/BANDAGES/DRESSINGS) ×1 IMPLANT
STERI STRIP BROWN 1/4X3 R155 (GAUZE/BANDAGES/DRESSINGS) ×1 IMPLANT
SUT CHROMIC 0 CTX 36 (SUTURE) ×6 IMPLANT
SUT MON AB 4-0 PS1 27 (SUTURE) ×2 IMPLANT
SUT PDS AB 0 CT1 27 (SUTURE) ×4 IMPLANT
SUT VIC AB 3-0 CT1 27 (SUTURE) ×4
SUT VIC AB 3-0 CT1 TAPERPNT 27 (SUTURE) ×2 IMPLANT
TOWEL OR 17X24 6PK STRL BLUE (TOWEL DISPOSABLE) ×4 IMPLANT
TRAY FOLEY CATH 14FR (SET/KITS/TRAYS/PACK) ×2 IMPLANT
WATER STERILE IRR 1000ML POUR (IV SOLUTION) ×2 IMPLANT

## 2011-03-06 NOTE — Anesthesia Preprocedure Evaluation (Signed)
Anesthesia Evaluation  Patient identified by MRN, date of birth, ID band Patient awake    Reviewed: Allergy & Precautions, H&P , NPO status , Patient's Chart, lab work & pertinent test results, reviewed documented beta blocker date and time   Airway Mallampati: I TM Distance: >3 FB Neck ROM: full    Dental  (+) Teeth Intact   Pulmonary neg pulmonary ROS,  clear to auscultation        Cardiovascular neg cardio ROS regular Normal    Neuro/Psych PSYCHIATRIC DISORDERS (anxiety, panic attacks) Pinched nerve in neck - on chronic percocet therapy (1-2 pills/day) Lower back strain x 3 months    GI/Hepatic Neg liver ROS, GERD-  Medicated,Nausea today   Endo/Other  Negative Endocrine ROS  Renal/GU UTI, kidney stones  Genitourinary negative   Musculoskeletal   Abdominal   Peds  Hematology negative hematology ROS (+) Blood dyscrasia (hgb 8.9), anemia ,   Anesthesia Other Findings Rash x 3 months  Reproductive/Obstetrics                           Anesthesia Physical Anesthesia Plan  ASA: II  Anesthesia Plan: Epidural   Post-op Pain Management:    Induction:   Airway Management Planned:   Additional Equipment:   Intra-op Plan:   Post-operative Plan:   Informed Consent: I have reviewed the patients History and Physical, chart, labs and discussed the procedure including the risks, benefits and alternatives for the proposed anesthesia with the patient or authorized representative who has indicated his/her understanding and acceptance.     Plan Discussed with:   Anesthesia Plan Comments:         Anesthesia Quick Evaluation

## 2011-03-06 NOTE — Transfer of Care (Signed)
Immediate Anesthesia Transfer of Care Note  Patient: Valerie Cole  Procedure(s) Performed:  CESAREAN SECTION WITH BILATERAL TUBAL LIGATION - repeat  Patient Location: PACU  Anesthesia Type: Spinal  Level of Consciousness: awake, alert  and oriented  Airway & Oxygen Therapy: Patient Spontanous Breathing  Post-op Assessment: Report given to PACU RN and Post -op Vital signs reviewed and stable  Post vital signs: Reviewed and stable  Complications: No apparent anesthesia complications

## 2011-03-06 NOTE — Progress Notes (Signed)
The patient was re-examined with no change in status 

## 2011-03-06 NOTE — Anesthesia Procedure Notes (Signed)
Spinal  Patient location during procedure: OR Start time: 03/06/2011 7:15 AM Staffing Anesthesiologist: Sharlee Rufino A. Performed by: anesthesiologist  Preanesthetic Checklist Completed: patient identified, site marked, surgical consent, pre-op evaluation, timeout performed, IV checked, risks and benefits discussed and monitors and equipment checked Spinal Block Patient position: sitting Prep: site prepped and draped and DuraPrep Patient monitoring: heart rate, cardiac monitor, continuous pulse ox and blood pressure Approach: midline Location: L3-4 Injection technique: single-shot Needle Needle type: Sprotte  Needle gauge: 24 G Needle length: 9 cm Needle insertion depth: 4 cm Assessment Sensory level: T4 Additional Notes Patient tolerated procedure well. Adequate sensory level.

## 2011-03-06 NOTE — Brief Op Note (Signed)
03/06/2011  8:13 AM  PATIENT:  Valerie Cole  41 y.o. female  PRE-OPERATIVE DIAGNOSIS:  2 previous; desires sterilizaation  POST-OPERATIVE DIAGNOSIS:  2 previous; desires sterilization  PROCEDURE:  Procedure(s): CESAREAN SECTION WITH BILATERAL TUBAL LIGATION  SURGEON:  Surgeon(s): Meriel Pica, MD  PHYSICIAN ASSISTANT:   ASSISTANTS: none   ANESTHESIA:   spinal  EBL:  Total I/O In: 4000 [I.V.:4000] Out: 800 [Urine:100; Blood:700]  BLOOD ADMINISTERED:none  DRAINS: Urinary Catheter (Foley)   LOCAL MEDICATIONS USED:  NONE  SPECIMEN:  No Specimen  DISPOSITION OF SPECIMEN:  N/A  COUNTS:  YES  TOURNIQUET:  * No tourniquets in log *  DICTATION: .Dr[facility] Dictation  PLAN OF CARE: Admit to inpatient   PATIENT DISPOSITION:  PACU - hemodynamically stable.   Delay start of Pharmacological VTE agent (>24hrs) due to surgical blood loss or risk of bleeding:  {YES/NO/NOT APPLICABLE:20182  Preoperative diagnosis: Term pregnancy, request permanent sterilization, previous cesarean section. PUPPP rash, nephrolithiasis  Postoperative diagnosis: Same  Procedure: Repeat low transverse cesarean section, lysis of adhesions, bilateral tubal ligation by Filshie clip application.  Surgeon: Marcelle Overlie  Assistant: None  EBL: None 100 cc  Drains: Foley catheter  Procedure and findings  The patient was taken the operating room after an adequate level of spinal anesthetic was obtained with the patient in left tilt position the abdomen prepped and draped in the usual manner for repeat cesarean section. Foley catheter positioned draining clear urine. We'll scar was excised in an ellipse carried down to the fascia which was incised and extended transversely. Rectus muscle divided in the midline peritoneum entered superiorly without incident and extended in a vertical fashion. There were some omental adhesions into the intra-abdominal wall which were lysed an avascular plane bladder blade was  positioned minimal bladder flap developed, transverse incision made the lower segment extended with blunt dissection this was moderately thin clear fluid noted the patient delivered of a healthy infant Apgars 9 and 9. The infant was suctioned cord clamped and passed the pediatric team for further care. Placenta was posterior was delivered spontaneously, uterus exteriorized cavity wiped clean with a laparotomy pack was noted be clean closure obtained the first layer of chromic in a locked fashion followed by Dimetane layer with chromic this was hemostatic the bladder flap area was inspected and noted to be intact and hemostatic clear. Clear urine noted at that point. Bilateral tubes and ovaries were normal Babcock clamp was used to place the right tube on traction a Filshie clip was applied a right ankle 3 cm from the cornu completely engulfing the tube the exact same repeated on the opposite side. After this was completed uterus returned its intra-abdominal position. Prior to closure sponge denies precast reported as correct x2. Enclose a running 2-0 Vicryl suture. 2-0 Vicryl interrupted sutures used to reapproximate the rectus muscles in the midline. A 0 PDS suture was then used from laterally to midline on either side to close the fascia subcutaneous tissue was fairly thin was undermined to reduce tension this was irrigated and made hemostatic with the Bovie for a Monocryl subcuticular closure along with benzoin and Steri-Strips and a pressure dressing she did receive Ancef 1 g IV preop and Pitocin IV after the cord clamp mother and baby doing well at that point to recovery room Dictated with [facility] [employer], [facility] Salvia. Milana Obey.D.

## 2011-03-06 NOTE — Anesthesia Postprocedure Evaluation (Signed)
Anesthesia Post Note  Patient: Valerie Cole  Procedure(s) Performed:  CESAREAN SECTION WITH BILATERAL TUBAL LIGATION - repeat  Anesthesia type: Spinal  Patient location: PACU  Post pain: Pain level controlled  Post assessment: Post-op Vital signs reviewed  Last Vitals:  Filed Vitals:   03/06/11 0608  BP: 137/84  Pulse: 84  Temp: 36.7 C  Resp: 18    Post vital signs: Reviewed  Level of consciousness: awake  Complications: No apparent anesthesia complications

## 2011-03-06 NOTE — Progress Notes (Signed)
Called to attend elective repeat C/S with no reported risk factors bearing on delivery.  At delivery infant in vertex with loose nuchal cord x 1.  Given tactile stim and bulb suction following placement under radiant warmer. No dysmorphic features noted. Infant voided x 1.    Shown to parents then allowed to remain in OR with parents for skin to skin c mother under care and responsibility of L/D RN.  Care to assigned pediatrician.    Dagoberto Ligas MD [facility]

## 2011-03-07 ENCOUNTER — Encounter (HOSPITAL_COMMUNITY): Payer: Self-pay | Admitting: Anesthesiology

## 2011-03-07 LAB — CBC
HCT: 23.6 % — ABNORMAL LOW (ref 36.0–46.0)
Hemoglobin: 7 g/dL — ABNORMAL LOW (ref 12.0–15.0)
MCH: 23.6 pg — ABNORMAL LOW (ref 26.0–34.0)
MCHC: 29.2 g/dL — ABNORMAL LOW (ref 30.0–36.0)
MCV: 80.8 fL (ref 78.0–100.0)
RBC: 2.92 MIL/uL — ABNORMAL LOW (ref 3.87–5.11)

## 2011-03-07 MED ORDER — BETAMETHASONE VALERATE 0.1 % EX LOTN
TOPICAL_LOTION | Freq: Four times a day (QID) | CUTANEOUS | Status: DC | PRN
  Filled 2011-03-07: qty 60

## 2011-03-07 MED ORDER — HYDROCORTISONE 0.5 % EX CREA
TOPICAL_CREAM | Freq: Three times a day (TID) | CUTANEOUS | Status: DC
  Filled 2011-03-07: qty 28.35

## 2011-03-07 MED ORDER — TRIAMCINOLONE ACETONIDE 0.1 % EX CREA
1.0000 "application " | TOPICAL_CREAM | CUTANEOUS | Status: DC | PRN
  Administered 2011-03-07 – 2011-03-08 (×2): 1 via TOPICAL
  Filled 2011-03-07: qty 30

## 2011-03-07 MED ORDER — NITROFURANTOIN MACROCRYSTAL 50 MG PO CAPS
50.0000 mg | ORAL_CAPSULE | Freq: Every day | ORAL | Status: DC
  Administered 2011-03-07 – 2011-03-09 (×3): 50 mg via ORAL
  Filled 2011-03-07 (×3): qty 1

## 2011-03-07 NOTE — Anesthesia Postprocedure Evaluation (Signed)
  Anesthesia Post-op Note  Patient: Valerie Cole  Procedure(s) Performed:  CESAREAN SECTION WITH BILATERAL TUBAL LIGATION - repeat  Patient Location: PACU and Mother/Baby  Anesthesia Type: Spinal  Level of Consciousness: awake, alert  and oriented  Airway and Oxygen Therapy: Patient Spontanous Breathing  Post-op Pain: moderate,receiving percocet and motrin, does not want IV pain medicine because she does not like the way it makes her feel loopy.  Post-op Assessment: Post-op Vital signs reviewed  Post-op Vital Signs: stable  Complications: No apparent anesthesia complications

## 2011-03-07 NOTE — Progress Notes (Signed)
Subjective: Postpartum Day 1: Cesarean Delivery Patient reports tolerating PO and + flatus.  C/o itching and rash on lower extrimities  Objective: Vital signs in last 24 hours: Temp:  [97.4 F (36.3 C)-98.6 F (37 C)] 98.4 F (36.9 C) (01/09 0300) Pulse Rate:  [63-126] 69  (01/09 0300) Resp:  [16-18] 18  (01/09 0300) BP: (93-129)/(54-82) 124/72 mmHg (01/09 0300) SpO2:  [96 %-100 %] 97 % (01/09 0500) Weight:  [79.833 kg (176 lb)] 79.833 kg (176 lb) (01/08 1013)  Physical Exam:  General: alert and cooperative Lochia: appropriate Uterine Fundus: firm abd dressing CDI DVT Evaluation: No evidence of DVT seen on physical exam. Foley discontinued , has not voided at the time of rounding Erythematous rash noted on lower extrimities  Basename 03/07/11 0535  HGB 7.0*  HCT 23.6*    Assessment/Plan: Status post Cesarean section. Doing well postoperatively.  Hydrocortisone cream for topical use CBC in am.  Malique Driskill G 03/07/2011, 8:10 AM

## 2011-03-07 NOTE — Progress Notes (Signed)

## 2011-03-08 LAB — CBC
HCT: 23.2 % — ABNORMAL LOW (ref 36.0–46.0)
MCH: 24.3 pg — ABNORMAL LOW (ref 26.0–34.0)
MCV: 81.7 fL (ref 78.0–100.0)
Platelets: 173 10*3/uL (ref 150–400)
RDW: 18 % — ABNORMAL HIGH (ref 11.5–15.5)

## 2011-03-08 MED ORDER — FERROUS SULFATE 325 (65 FE) MG PO TABS
325.0000 mg | ORAL_TABLET | Freq: Three times a day (TID) | ORAL | Status: DC
  Administered 2011-03-08 – 2011-03-09 (×3): 325 mg via ORAL
  Filled 2011-03-08 (×3): qty 1

## 2011-03-08 NOTE — Progress Notes (Signed)
Labs reported this morning on pt. [place]  Room 0124   hgb 6.9 hct 23.3 notified Dr. Henderson Cloud    No new orders givining

## 2011-03-08 NOTE — Progress Notes (Signed)
Subjective: Postpartum Day 2: Cesarean Delivery Patient reports tolerating PO, + flatus and no problems voiding.  Denies dizziness or SOB  Objective: Vital signs in last 24 hours: Temp:  [97.9 F (36.6 C)-98.7 F (37.1 C)] 97.9 F (36.6 C) (01/10 4098) Pulse Rate:  [65-79] 65  (01/10 0614) Resp:  [18-20] 18  (01/10 0614) BP: (110-124)/(64-70) 114/70 mmHg (01/10 1191)  Physical Exam:  General: alert and cooperative Lochia: appropriate Uterine Fundus: firm Incision: healing well DVT Evaluation: No evidence of DVT seen on physical exam.   Basename 03/08/11 0520 03/07/11 0535  HGB 6.9* 7.0*  HCT 23.2* 23.6*    Assessment/Plan: Status post Cesarean section. Doing well postoperatively.  Continue current care Feso4.  Valerie Cole 03/08/2011, 8:26 AM

## 2011-03-08 NOTE — Progress Notes (Signed)
RESULTS FROM LAB HGB 6.9 AND HCT 23.2 PHONED  IN AT 0630

## 2011-03-09 MED ORDER — OXYCODONE-ACETAMINOPHEN 5-325 MG PO TABS: 1.0000 | ORAL_TABLET | Freq: Four times a day (QID) | ORAL | Status: AC | PRN

## 2011-03-09 MED ORDER — IBUPROFEN 800 MG PO TABS: 800.0000 mg | ORAL_TABLET | Freq: Three times a day (TID) | ORAL | Status: AC | PRN

## 2011-03-09 MED ORDER — FERROUS SULFATE 325 (65 FE) MG PO TABS: 325.0000 mg | ORAL_TABLET | Freq: Three times a day (TID) | ORAL | Status: DC

## 2011-03-09 NOTE — Discharge Summary (Signed)
Obstetric Discharge Summary Reason for Admission: cesarean section and BTL Prenatal Procedures: ultrasound Intrapartum Procedures: cesarean: low cervical, transverse and tubal ligation Postpartum Procedures: none Complications-Operative and Postpartum: none Hemoglobin  Date Value Range Status  03/08/2011 6.9* 12.0-15.0 (g/dL) Final     DELTA CHECK NOTED     REPEATED TO VERIFY     CRITICAL RESULT CALLED TO, READ BACK BY AND VERIFIED WITH:     HERNANDEZ,M AT 0650 ON [id number] BY WOODSL     HCT  Date Value Range Status  03/08/2011 23.2* 36.0-46.0 (%) Final    Discharge Diagnoses: Term Pregnancy-delivered  Discharge Information: Date: 03/09/2011 Activity: pelvic rest Diet: routine Medications: PNV, Ibuprofen, Percocet and fe and Macrodantin Condition: improved Instructions: refer to practice specific booklet Discharge to: home   Newborn Data: Live born female  Birth Weight: 6 lb 8.8 oz (2970 g) APGAR: 9, 9  Home with mother.  Valerie Cole G 03/09/2011, 8:21 AM

## 2011-03-09 NOTE — Progress Notes (Signed)
Subjective: Postpartum Day 3: Cesarean Delivery Patient reports tolerating PO, + flatus and no problems voiding.    Objective: Vital signs in last 24 hours: Temp:  [97.9 F (36.6 C)-98.2 F (36.8 C)] 98.2 F (36.8 C) (01/11 0553) Pulse Rate:  [71-80] 71  (01/11 0553) Resp:  [18-20] 20  (01/11 0553) BP: (119-130)/(76-82) 130/82 mmHg (01/11 0553)  Physical Exam:  General: alert and cooperative Lochia: appropriate Uterine Fundus: firm Incision: healing well DVT Evaluation: No evidence of DVT seen on physical exam.   Basename 03/08/11 0520 03/07/11 0535  HGB 6.9* 7.0*  HCT 23.2* 23.6*    Assessment/Plan: Status post Cesarean section. Doing well postoperatively.  Discharge home with standard precautions and return to clinic in 1 week.  Lashawne Dura G 03/09/2011, 8:11 AM

## 2012-11-10 ENCOUNTER — Telehealth: Payer: Self-pay | Admitting: Nurse Practitioner

## 2012-11-10 NOTE — Telephone Encounter (Signed)
Dizziness and 2 episodes of vomiting upon standing.  Last episode was at 9:00. She was able tolerate peanut butter crackers at lunch.  Suggested liquid to light diet and increase fluids.  No solid food today and bland food tomorrow. No dairy or caffeine. She will call if symptoms worsen or persist. Patient states understanding and agreement to plan.

## 2012-11-13 ENCOUNTER — Ambulatory Visit (INDEPENDENT_AMBULATORY_CARE_PROVIDER_SITE_OTHER): Payer: PRIVATE HEALTH INSURANCE | Admitting: Nurse Practitioner

## 2012-11-13 ENCOUNTER — Telehealth: Payer: Self-pay | Admitting: Nurse Practitioner

## 2012-11-13 VITALS — BP 114/76 | HR 70 | Temp 97.5°F | Ht 67.0 in | Wt 161.0 lb

## 2012-11-13 DIAGNOSIS — F909 Attention-deficit hyperactivity disorder, unspecified type: Secondary | ICD-10-CM

## 2012-11-13 DIAGNOSIS — R42 Dizziness and giddiness: Secondary | ICD-10-CM

## 2012-11-13 DIAGNOSIS — G4700 Insomnia, unspecified: Secondary | ICD-10-CM

## 2012-11-13 MED ORDER — MECLIZINE HCL 25 MG PO TABS: 25.0000 mg | ORAL_TABLET | Freq: Three times a day (TID) | ORAL | Status: DC | PRN

## 2012-11-13 MED ORDER — LISDEXAMFETAMINE DIMESYLATE 40 MG PO CAPS: 40.0000 mg | ORAL_CAPSULE | ORAL | Status: DC

## 2012-11-13 NOTE — Telephone Encounter (Signed)
APPT MADE

## 2012-11-13 NOTE — Patient Instructions (Signed)
Vertigo Vertigo means you feel like you or your surroundings are moving when they are not. Vertigo can be dangerous if it occurs when you are at work, driving, or performing difficult activities.  CAUSES  Vertigo occurs when there is a conflict of signals sent to your brain from the visual and sensory systems in your body. There are many different causes of vertigo, including:  Infections, especially in the inner ear.  A bad reaction to a drug or misuse of alcohol and medicines.  Withdrawal from drugs or alcohol.  Rapidly changing positions, such as lying down or rolling over in bed.  A migraine headache.  Decreased blood flow to the brain.  Increased pressure in the brain from a head injury, infection, tumor, or bleeding. SYMPTOMS  You may feel as though the world is spinning around or you are falling to the ground. Because your balance is upset, vertigo can cause nausea and vomiting. You may have involuntary eye movements (nystagmus). DIAGNOSIS  Vertigo is usually diagnosed by physical exam. If the cause of your vertigo is unknown, your caregiver may perform imaging tests, such as an MRI scan (magnetic resonance imaging). TREATMENT  Most cases of vertigo resolve on their own, without treatment. Depending on the cause, your caregiver may prescribe certain medicines. If your vertigo is related to body position issues, your caregiver may recommend movements or procedures to correct the problem. In rare cases, if your vertigo is caused by certain inner ear problems, you may need surgery. HOME CARE INSTRUCTIONS   Follow your caregiver's instructions.  Avoid driving.  Avoid operating heavy machinery.  Avoid performing any tasks that would be dangerous to you or others during a vertigo episode.  Tell your caregiver if you notice that certain medicines seem to be causing your vertigo. Some of the medicines used to treat vertigo episodes can actually make them worse in some people. SEEK  IMMEDIATE MEDICAL CARE IF:   Your medicines do not relieve your vertigo or are making it worse.  You develop problems with talking, walking, weakness, or using your arms, hands, or legs.  You develop severe headaches.  Your nausea or vomiting continues or gets worse.  You develop visual changes.  A family member notices behavioral changes.  Your condition gets worse. MAKE SURE YOU:  Understand these instructions.  Will watch your condition.  Will get help right away if you are not doing well or get worse. Document Released: 11/22/2004 Document Revised: 05/07/2011 Document Reviewed: 08/31/2010 Exit[employer] Patient Information 2014 [employer].  

## 2012-11-13 NOTE — Progress Notes (Signed)
  Subjective:    Patient ID: Valerie Cole, female    DOB: Aug 27, 1970, 42 y.o.   MRN: [id number]  HPI1. Patient in today C/o of dizziness that started this morning.- Mainly when she stands up- sometimes just changing head position will cause dizziness. 2 ADult ADHD- Patient has started a new job that requires a lot of focus- Patient stopped taking vyvanse when she was prgenant and child is now 21 months old- Needs to be back on meds 3. Trouble sleeping at night- says she just can't shut her brain off to rest.    Review of Systems  All other systems reviewed and are negative.       Objective:   Physical Exam  Constitutional: She is oriented to person, place, and time. She appears well-developed and well-nourished.  Cardiovascular: Normal rate, regular rhythm and normal heart sounds.   Pulmonary/Chest: Effort normal and breath sounds normal.  Neurological: She is alert and oriented to person, place, and time. She has normal reflexes. No cranial nerve deficit.  Psychiatric: She has a normal mood and affect. Her behavior is normal. Judgment and thought content normal.   BP 114/76  Pulse 70  Temp(Src) 97.5 F (36.4 C) (Oral)  Ht 5\' 7"  (1.702 m)  Wt 161 lb (73.029 kg)  BMI 25.21 kg/m2        Assessment & Plan:  1. Vertigo Force fluids Rest No driving - meclizine (ANTIVERT) 25 MG tablet; Take 1 tablet (25 mg total) by mouth 3 (three) times daily as needed.  Dispense: 30 tablet; Refill: 0  2. Adult ADHD Stress management - lisdexamfetamine (VYVANSE) 40 MG capsule; Take 1 capsule (40 mg total) by mouth every morning.  Dispense: 30 capsule; Refill: 0 - lisdexamfetamine (VYVANSE) 40 MG capsule; Take 1 capsule (40 mg total) by mouth every morning.  Dispense: 30 capsule; Refill: 0  3. Insomnia Bedtime ritual [place] at night  Mary-Margaret Daphine Deutscher, FNP

## 2012-11-28 ENCOUNTER — Ambulatory Visit: Payer: Self-pay | Admitting: Nurse Practitioner

## 2012-12-31 ENCOUNTER — Ambulatory Visit (INDEPENDENT_AMBULATORY_CARE_PROVIDER_SITE_OTHER): Payer: PRIVATE HEALTH INSURANCE | Admitting: Nurse Practitioner

## 2012-12-31 ENCOUNTER — Ambulatory Visit (INDEPENDENT_AMBULATORY_CARE_PROVIDER_SITE_OTHER): Payer: PRIVATE HEALTH INSURANCE

## 2012-12-31 ENCOUNTER — Encounter (INDEPENDENT_AMBULATORY_CARE_PROVIDER_SITE_OTHER): Payer: Self-pay

## 2012-12-31 ENCOUNTER — Encounter: Payer: Self-pay | Admitting: Nurse Practitioner

## 2012-12-31 VITALS — BP 110/64 | HR 64 | Temp 98.5°F | Ht 67.0 in | Wt 145.0 lb

## 2012-12-31 DIAGNOSIS — F909 Attention-deficit hyperactivity disorder, unspecified type: Secondary | ICD-10-CM

## 2012-12-31 DIAGNOSIS — M25559 Pain in unspecified hip: Secondary | ICD-10-CM

## 2012-12-31 DIAGNOSIS — M25551 Pain in right hip: Secondary | ICD-10-CM

## 2012-12-31 MED ORDER — LISDEXAMFETAMINE DIMESYLATE 40 MG PO CAPS: 40.0000 mg | ORAL_CAPSULE | ORAL | Status: DC

## 2012-12-31 MED ORDER — PREDNISONE (PAK) 10 MG PO TABS: ORAL_TABLET | Freq: Every day | ORAL | Status: DC

## 2012-12-31 NOTE — Patient Instructions (Signed)
Hip Pain  The hips join the upper legs to the lower pelvis. The bones, cartilage, tendons, and muscles of the hip joint perform a lot of work each day holding your body weight and allowing you to move around.  Hip pain is a common symptom. It can range from a minor ache to severe pain on 1 or both hips. Pain may be felt on the inside of the hip joint near the groin, or the outside near the buttocks and upper thigh. There may be swelling or stiffness as well. It occurs more often when a person walks or performs activity. There are many reasons hip pain can develop.  CAUSES   It is important to work with your caregiver to identify the cause since many conditions can impact the bones, cartilage, muscles, and tendons of the hips. Causes for hip pain include:   Broken (fractured) bones.   Separation of the thighbone from the hip socket (dislocation).   Torn cartilage of the hip joint.   Swelling (inflammation) of a tendon (tendonitis), the sac within the hip joint (bursitis), or a joint.   A weakening in the abdominal wall (hernia), affecting the nerves to the hip.   Arthritis in the hip joint or lining of the hip joint.   Pinched nerves in the back, hip, or upper thigh.   A bulging disc in the spine (herniated disc).   Rarely, bone infection or cancer.  DIAGNOSIS   The location of your hip pain will help your caregiver understand what may be causing the pain. A diagnosis is based on your medical history, your symptoms, results from your physical exam, and results from diagnostic tests. Diagnostic tests may include X-ray exams, a computerized magnetic scan (magnetic resonance imaging, MRI), or bone scan.  TREATMENT   Treatment will depend on the cause of your hip pain. Treatment may include:   Limiting activities and resting until symptoms improve.   Crutches or other walking supports (a cane or brace).   Ice, elevation, and compression.   Physical therapy or home exercises.   Shoe inserts or special  shoes.   Losing weight.   Medications to reduce pain.   Undergoing surgery.  HOME CARE INSTRUCTIONS    Only take over-the-counter or prescription medicines for pain, discomfort, or fever as directed by your caregiver.   Put ice on the injured area:   Put ice in a plastic bag.   Place a towel between your skin and the bag.   Leave the ice on for 15-20 minutes at a time, 03-04 times a day.   Keep your leg raised (elevated) when possible to lessen swelling.   Avoid activities that cause pain.   Follow specific exercises as directed by your caregiver.   Sleep with a pillow between your legs on your most comfortable side.   Record how often you have hip pain, the location of the pain, and what it feels like. This information may be helpful to you and your caregiver.   Ask your caregiver about returning to work or sports and whether you should drive.   Follow up with your caregiver for further exams, therapy, or testing as directed.  SEEK MEDICAL CARE IF:    Your pain or swelling continues or worsens after 1 week.   You are feeling unwell or have chills.   You have increasing difficulty with walking.   You have a loss of sensation or other new symptoms.   You have questions or concerns.  SEEK   IMMEDIATE MEDICAL CARE IF:    You cannot put weight on the affected hip.   You have fallen.   You have a sudden increase in pain and swelling in your hip.   You have a fever.  MAKE SURE YOU:    Understand these instructions.   Will watch your condition.   Will get help right away if you are not doing well or get worse.  Document Released: 08/02/2009 Document Revised: 05/07/2011 Document Reviewed: 08/02/2009  [employer] Patient Information 2014 [employer].

## 2012-12-31 NOTE — Progress Notes (Signed)
  Subjective:    Patient ID: Valerie Cole, female    DOB: [birth year], 42 y.o.   MRN: [id number]  HPI  Patient in today for follow up of ADHD- she has had this since she was a child- She is currently on vvanse 40mg  daily- this dose is working well. No c/o side effects. 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 ratse pain 6/10 and at times 8/10. noithing helps- has tried motrin OTC.    Review of Systems  Constitutional: Negative.   HENT: Negative.   Respiratory: Negative.   Cardiovascular: Negative.   Musculoskeletal: Positive for arthralgias (right  hip).  Hematological: Negative.   Psychiatric/Behavioral: Negative.   All other systems reviewed and are negative.       Objective:   Physical Exam  Constitutional: She is oriented to person, place, and time. She appears well-developed and well-nourished.  Cardiovascular: Normal rate, normal heart sounds and intact distal pulses.   Pulmonary/Chest: Effort normal and breath sounds normal.  Musculoskeletal:  From of right hip with pain on abduction.  Neurological: She is alert and oriented to person, place, and time.  Psychiatric: She has a normal mood and affect. Her behavior is normal. Judgment and thought content normal.   BP 110/64  Pulse 64  Temp(Src) 98.5 F (36.9 C) (Oral)  Ht 5\' 7"  (1.702 m)  Wt 145 lb (65.772 kg)  BMI 22.71 kg/m2 Right hip xray- early osteoarthritis-Preliminary reading by Paulene Floor, FNP  [facility]        Assessment & Plan:  1. Right hip pain Decrease activity if possible to allow hip to rest - DG Hip Complete Right; Future - predniSONE (STERAPRED UNI-PAK) 10 MG tablet; Take by mouth daily. As directed X 6 days  Dispense: 21 tablet; Refill: 0  2. Adult ADHD Stress management - lisdexamfetamine (VYVANSE) 40 MG capsule; Take 1 capsule (40 mg total) by mouth every morning.  Dispense: 30 capsule; Refill: 0 - lisdexamfetamine  (VYVANSE) 40 MG capsule; Take 1 capsule (40 mg total) by mouth every morning.  Dispense: 30 capsule; Refill: 0  Mary-Margaret Daphine Deutscher, FNP

## 2013-01-01 ENCOUNTER — Telehealth: Payer: Self-pay | Admitting: Nurse Practitioner

## 2013-01-01 NOTE — Telephone Encounter (Signed)
800mg  of ibuprofen and rest- going to have to stay off of it as much as possible to rest it- no more zumba for awhile

## 2013-01-02 NOTE — Telephone Encounter (Signed)
Patient aware it is so much better today

## 2013-02-25 ENCOUNTER — Other Ambulatory Visit: Payer: Self-pay | Admitting: Nurse Practitioner

## 2013-02-25 DIAGNOSIS — N6489 Other specified disorders of breast: Secondary | ICD-10-CM

## 2013-03-05 ENCOUNTER — Ambulatory Visit
Admission: RE | Admit: 2013-03-05 | Discharge: 2013-03-05 | Disposition: A | Discharge status: Home / Self Care | Payer: PRIVATE HEALTH INSURANCE | Source: Ambulatory Visit | Attending: Nurse Practitioner | Admitting: Nurse Practitioner

## 2013-03-05 ENCOUNTER — Other Ambulatory Visit: Payer: Self-pay | Admitting: Nurse Practitioner

## 2013-03-05 DIAGNOSIS — N6489 Other specified disorders of breast: Secondary | ICD-10-CM

## 2013-03-10 ENCOUNTER — Telehealth: Payer: Self-pay | Admitting: Nurse Practitioner

## 2013-03-10 DIAGNOSIS — F909 Attention-deficit hyperactivity disorder, unspecified type: Secondary | ICD-10-CM

## 2013-03-10 MED ORDER — LISDEXAMFETAMINE DIMESYLATE 40 MG PO CAPS: 40.0000 mg | ORAL_CAPSULE | ORAL | Status: DC

## 2013-03-10 NOTE — Telephone Encounter (Signed)
rx ready for pickup 

## 2013-03-11 ENCOUNTER — Telehealth: Payer: Self-pay | Admitting: *Deleted

## 2013-03-11 NOTE — Telephone Encounter (Signed)
Rx for vyvance ready, pt aware.

## 2013-03-11 NOTE — Telephone Encounter (Signed)
Pt.notified

## 2013-04-09 ENCOUNTER — Telehealth: Payer: Self-pay | Admitting: *Deleted

## 2013-04-09 ENCOUNTER — Telehealth: Payer: Self-pay | Admitting: Nurse Practitioner

## 2013-04-09 DIAGNOSIS — F909 Attention-deficit hyperactivity disorder, unspecified type: Secondary | ICD-10-CM

## 2013-04-09 MED ORDER — LISDEXAMFETAMINE DIMESYLATE 40 MG PO CAPS: 40.0000 mg | ORAL_CAPSULE | ORAL | Status: DC

## 2013-04-09 NOTE — Telephone Encounter (Signed)
Aware,rx ready of vyvance.

## 2013-04-09 NOTE — Telephone Encounter (Signed)
rx ready for pick up NTBS fo rfuture refill 

## 2013-05-08 ENCOUNTER — Telehealth: Payer: Self-pay | Admitting: Nurse Practitioner

## 2013-05-08 DIAGNOSIS — F909 Attention-deficit hyperactivity disorder, unspecified type: Secondary | ICD-10-CM

## 2013-05-08 MED ORDER — LISDEXAMFETAMINE DIMESYLATE 40 MG PO CAPS: 40.0000 mg | ORAL_CAPSULE | ORAL | Status: DC

## 2013-05-08 NOTE — Telephone Encounter (Signed)
No more refills without being seen rx ready for pick up

## 2013-05-09 NOTE — Telephone Encounter (Signed)
Patient aware and will schedule appt

## 2013-06-03 ENCOUNTER — Telehealth: Payer: Self-pay | Admitting: Nurse Practitioner

## 2013-06-03 DIAGNOSIS — F909 Attention-deficit hyperactivity disorder, unspecified type: Secondary | ICD-10-CM

## 2013-06-03 MED ORDER — LISDEXAMFETAMINE DIMESYLATE 40 MG PO CAPS: 40.0000 mg | ORAL_CAPSULE | ORAL | Status: DC

## 2013-06-03 NOTE — Telephone Encounter (Signed)
rx ready for pickup 

## 2013-07-01 ENCOUNTER — Ambulatory Visit (INDEPENDENT_AMBULATORY_CARE_PROVIDER_SITE_OTHER): Payer: PRIVATE HEALTH INSURANCE | Admitting: Nurse Practitioner

## 2013-07-01 ENCOUNTER — Encounter: Payer: Self-pay | Admitting: Nurse Practitioner

## 2013-07-01 VITALS — BP 135/76 | HR 91 | Temp 98.4°F | Ht 67.0 in | Wt 153.0 lb

## 2013-07-01 DIAGNOSIS — G4700 Insomnia, unspecified: Secondary | ICD-10-CM

## 2013-07-01 DIAGNOSIS — F909 Attention-deficit hyperactivity disorder, unspecified type: Secondary | ICD-10-CM

## 2013-07-01 MED ORDER — LISDEXAMFETAMINE DIMESYLATE 50 MG PO CAPS: 50.0000 mg | ORAL_CAPSULE | ORAL | Status: DC

## 2013-07-01 MED ORDER — ZOLPIDEM TARTRATE 5 MG PO TABS: 5.0000 mg | ORAL_TABLET | Freq: Every evening | ORAL | Status: DC | PRN

## 2013-07-01 NOTE — Patient Instructions (Signed)

## 2013-07-01 NOTE — Progress Notes (Signed)
   Subjective:    Patient ID: Valerie Cole, female    DOB: 1970-04-22, 43 y.o.   MRN: [id number]  HPI Patient here today for adult ADHD follow up- She is currently on vyvanse 40mg  daily- She says that some days she feels like she hasn't taken it at all and other days she can tell that it is working.    Review of Systems  Constitutional: Negative.   HENT: Negative.   Respiratory: Negative.   Cardiovascular: Negative.   Genitourinary: Negative.   Psychiatric/Behavioral: Negative.   All other systems reviewed and are negative.      Objective:   Physical Exam  Constitutional: She is oriented to person, place, and time. She appears well-developed and well-nourished.  Cardiovascular: Normal rate, regular rhythm and normal heart sounds.   Pulmonary/Chest: Effort normal and breath sounds normal.  Neurological: She is alert and oriented to person, place, and time.  Skin: Skin is warm.  Psychiatric: She has a normal mood and affect. Her behavior is normal. Judgment and thought content normal.   BP 135/76  Pulse 91  Temp(Src) 98.4 F (36.9 C) (Oral)  Ht 5\' 7"  (1.702 m)  Wt 153 lb (69.4 kg)  BMI 23.96 kg/m2        Assessment & Plan:  1. Adult ADHD Stress management - lisdexamfetamine (VYVANSE) 50 MG capsule; Take 1 capsule (50 mg total) by mouth every morning.  Dispense: 30 capsule; Refill: 0 - lisdexamfetamine (VYVANSE) 50 MG capsule; Take 1 capsule (50 mg total) by mouth every morning.  Dispense: 30 capsule; Refill: 0  2. Insomnia Bedtime ritual - zolpidem (AMBIEN) 5 MG tablet; Take 1 tablet (5 mg total) by mouth at bedtime as needed for sleep.  Dispense: 30 tablet; Refill: 1  Mary-Margaret Daphine DeutscherMartin, FNP

## 2013-08-31 ENCOUNTER — Telehealth: Payer: Self-pay | Admitting: Nurse Practitioner

## 2013-08-31 DIAGNOSIS — F909 Attention-deficit hyperactivity disorder, unspecified type: Secondary | ICD-10-CM

## 2013-08-31 MED ORDER — LISDEXAMFETAMINE DIMESYLATE 50 MG PO CAPS: 50.0000 mg | ORAL_CAPSULE | ORAL | Status: DC

## 2013-08-31 NOTE — Telephone Encounter (Signed)
rx ready for pickup 

## 2013-09-01 NOTE — Telephone Encounter (Signed)
Patient aware.

## 2013-09-29 ENCOUNTER — Telehealth: Payer: Self-pay | Admitting: Nurse Practitioner

## 2013-09-29 DIAGNOSIS — F909 Attention-deficit hyperactivity disorder, unspecified type: Secondary | ICD-10-CM

## 2013-09-29 MED ORDER — LISDEXAMFETAMINE DIMESYLATE 50 MG PO CAPS: 50.0000 mg | ORAL_CAPSULE | ORAL | Status: DC

## 2013-09-29 NOTE — Telephone Encounter (Signed)
Patient aware.

## 2013-09-29 NOTE — Telephone Encounter (Signed)
rx ready for pickup 

## 2013-10-27 ENCOUNTER — Telehealth: Payer: Self-pay | Admitting: Nurse Practitioner

## 2013-10-27 DIAGNOSIS — F909 Attention-deficit hyperactivity disorder, unspecified type: Secondary | ICD-10-CM

## 2013-10-27 MED ORDER — LISDEXAMFETAMINE DIMESYLATE 50 MG PO CAPS: 50.0000 mg | ORAL_CAPSULE | ORAL | Status: DC

## 2013-10-27 NOTE — Telephone Encounter (Signed)
rx ready for pickup 

## 2013-10-27 NOTE — Telephone Encounter (Signed)
Pt notified to pick up Rx 

## 2013-11-27 ENCOUNTER — Telehealth: Payer: Self-pay | Admitting: Nurse Practitioner

## 2013-11-27 DIAGNOSIS — F909 Attention-deficit hyperactivity disorder, unspecified type: Secondary | ICD-10-CM

## 2013-11-28 ENCOUNTER — Telehealth: Payer: Self-pay | Admitting: Nurse Practitioner

## 2013-11-30 MED ORDER — LISDEXAMFETAMINE DIMESYLATE 50 MG PO CAPS: 50.0000 mg | ORAL_CAPSULE | ORAL | Status: DC

## 2013-11-30 NOTE — Telephone Encounter (Signed)
Was done earlier today 

## 2013-11-30 NOTE — Telephone Encounter (Signed)
Patient aware.

## 2013-11-30 NOTE — Telephone Encounter (Signed)
rx ready for pickup 

## 2013-12-25 ENCOUNTER — Telehealth: Payer: Self-pay | Admitting: Nurse Practitioner

## 2013-12-25 DIAGNOSIS — F909 Attention-deficit hyperactivity disorder, unspecified type: Secondary | ICD-10-CM

## 2013-12-28 ENCOUNTER — Encounter: Payer: Self-pay | Admitting: Nurse Practitioner

## 2013-12-28 MED ORDER — LISDEXAMFETAMINE DIMESYLATE 50 MG PO CAPS: 50.0000 mg | ORAL_CAPSULE | ORAL | Status: DC

## 2013-12-28 NOTE — Telephone Encounter (Signed)
rx ready for pickup 

## 2013-12-29 NOTE — Telephone Encounter (Signed)
Patient aware.

## 2014-01-28 ENCOUNTER — Telehealth: Payer: Self-pay | Admitting: Nurse Practitioner

## 2014-01-28 DIAGNOSIS — F909 Attention-deficit hyperactivity disorder, unspecified type: Secondary | ICD-10-CM

## 2014-01-28 MED ORDER — LISDEXAMFETAMINE DIMESYLATE 50 MG PO CAPS: 50.0000 mg | ORAL_CAPSULE | ORAL | Status: DC

## 2014-01-28 NOTE — Telephone Encounter (Signed)
Is okay to refill 

## 2014-01-28 NOTE — Telephone Encounter (Signed)
Done and pt aware

## 2014-02-22 ENCOUNTER — Telehealth: Payer: Self-pay | Admitting: *Deleted

## 2014-02-22 ENCOUNTER — Telehealth: Payer: Self-pay | Admitting: Nurse Practitioner

## 2014-02-22 DIAGNOSIS — F909 Attention-deficit hyperactivity disorder, unspecified type: Secondary | ICD-10-CM

## 2014-02-22 MED ORDER — LISDEXAMFETAMINE DIMESYLATE 50 MG PO CAPS: 50.0000 mg | ORAL_CAPSULE | ORAL | Status: DC

## 2014-02-22 NOTE — Telephone Encounter (Signed)
vyvanse rx ready for pick up  

## 2014-02-22 NOTE — Telephone Encounter (Signed)
Aware, vyvance script ready. 

## 2014-03-08 ENCOUNTER — Encounter (INDEPENDENT_AMBULATORY_CARE_PROVIDER_SITE_OTHER): Payer: Self-pay

## 2014-03-08 ENCOUNTER — Ambulatory Visit (INDEPENDENT_AMBULATORY_CARE_PROVIDER_SITE_OTHER): Payer: BLUE CROSS/BLUE SHIELD | Admitting: Family Medicine

## 2014-03-08 ENCOUNTER — Ambulatory Visit (INDEPENDENT_AMBULATORY_CARE_PROVIDER_SITE_OTHER): Payer: BLUE CROSS/BLUE SHIELD

## 2014-03-08 ENCOUNTER — Encounter: Payer: Self-pay | Admitting: Family Medicine

## 2014-03-08 VITALS — BP 107/64 | HR 95 | Ht 67.0 in | Wt 137.0 lb

## 2014-03-08 DIAGNOSIS — M542 Cervicalgia: Secondary | ICD-10-CM

## 2014-03-08 MED ORDER — CYCLOBENZAPRINE HCL 5 MG PO TABS: 5.0000 mg | ORAL_TABLET | Freq: Three times a day (TID) | ORAL | Status: DC | PRN

## 2014-03-08 NOTE — Progress Notes (Signed)
   Subjective:    Patient ID: Valerie PolingAmy O Cole, female    DOB: Feb 21, 1971, 44 y.o.   MRN: [id number]  HPI Patient is here for c/o falling down her stairs and she is having neck and back discomfort.  She is hurting more in her neck and back pain is just about resolved.  She was hurting in her right shoulder but that is better.  Review of Systems  Constitutional: Negative for fever.  HENT: Negative for ear pain.   Eyes: Negative for discharge.  Respiratory: Negative for cough.   Cardiovascular: Negative for chest pain.  Gastrointestinal: Negative for abdominal distention.  Endocrine: Negative for polyuria.  Genitourinary: Negative for difficulty urinating.  Musculoskeletal: Negative for gait problem and neck pain.  Skin: Negative for color change and rash.  Neurological: Negative for speech difficulty and headaches.  Psychiatric/Behavioral: Negative for agitation.       Objective:    BP 107/64 mmHg  Pulse 95  Ht 5\' 7"  (1.702 m)  Wt 137 lb (62.143 kg)  BMI 21.45 kg/m2  LMP 02/19/2014 (Approximate) Physical Exam  Constitutional: She is oriented to person, place, and time. She appears well-developed and well-nourished.  HENT:  Head: Normocephalic and atraumatic.  Mouth/Throat: Oropharynx is clear and moist.  Eyes: Pupils are equal, round, and reactive to light.  Neck: Normal range of motion. Neck supple.  Cardiovascular: Normal rate and regular rhythm.   No murmur heard. Pulmonary/Chest: Effort normal and breath sounds normal.  Abdominal: Soft. Bowel sounds are normal. There is no tenderness.  Neurological: She is alert and oriented to person, place, and time.  Skin: Skin is warm and dry.  Psychiatric: She has a normal mood and affect.    Cervical spine xray - No fracture Prelimnary reading by Angeline SlimWilliam Laurann Mcmorris,FNP      Assessment & Plan:     ICD-9-CM ICD-10-CM   1. Cervicalgia 723.1 M54.2 DG Cervical Spine Complete     No Follow-up on file.  Deatra CanterWilliam J Nevea Spiewak FNP

## 2014-03-23 ENCOUNTER — Telehealth: Payer: Self-pay | Admitting: Nurse Practitioner

## 2014-03-23 DIAGNOSIS — F909 Attention-deficit hyperactivity disorder, unspecified type: Secondary | ICD-10-CM

## 2014-03-23 MED ORDER — LISDEXAMFETAMINE DIMESYLATE 50 MG PO CAPS: 50.0000 mg | ORAL_CAPSULE | ORAL | Status: DC

## 2014-03-23 NOTE — Telephone Encounter (Signed)
vyvanse rx ready for pick up  

## 2014-03-23 NOTE — Telephone Encounter (Signed)
Pt aware.

## 2014-04-23 ENCOUNTER — Telehealth: Payer: Self-pay | Admitting: Nurse Practitioner

## 2014-04-23 DIAGNOSIS — F909 Attention-deficit hyperactivity disorder, unspecified type: Secondary | ICD-10-CM

## 2014-04-23 MED ORDER — LISDEXAMFETAMINE DIMESYLATE 50 MG PO CAPS: 50.0000 mg | ORAL_CAPSULE | ORAL | Status: DC

## 2014-04-23 NOTE — Telephone Encounter (Signed)
vyvnase rx ready for pick up  

## 2014-04-26 NOTE — Telephone Encounter (Signed)
Message left that rx is ready to be picked

## 2014-05-25 ENCOUNTER — Telehealth: Payer: Self-pay | Admitting: Nurse Practitioner

## 2014-05-25 DIAGNOSIS — F909 Attention-deficit hyperactivity disorder, unspecified type: Secondary | ICD-10-CM

## 2014-05-26 MED ORDER — LISDEXAMFETAMINE DIMESYLATE 50 MG PO CAPS: 50.0000 mg | ORAL_CAPSULE | ORAL | Status: DC

## 2014-05-26 NOTE — Telephone Encounter (Signed)
vyvanse rx ready for pick up no more refills without being seen  

## 2014-06-24 ENCOUNTER — Telehealth: Payer: Self-pay | Admitting: Nurse Practitioner

## 2014-06-24 DIAGNOSIS — F909 Attention-deficit hyperactivity disorder, unspecified type: Secondary | ICD-10-CM

## 2014-06-24 MED ORDER — LISDEXAMFETAMINE DIMESYLATE 50 MG PO CAPS: 50.0000 mg | ORAL_CAPSULE | ORAL | Status: DC

## 2014-06-24 NOTE — Telephone Encounter (Signed)
vyvanse rx ready for pick up  

## 2014-06-24 NOTE — Telephone Encounter (Signed)
Pt aware written Rx at front desk ready for pickup 

## 2014-07-19 ENCOUNTER — Telehealth: Payer: Self-pay | Admitting: Nurse Practitioner

## 2014-07-19 DIAGNOSIS — F909 Attention-deficit hyperactivity disorder, unspecified type: Secondary | ICD-10-CM

## 2014-07-19 MED ORDER — LISDEXAMFETAMINE DIMESYLATE 50 MG PO CAPS: 50.0000 mg | ORAL_CAPSULE | ORAL | Status: DC

## 2014-07-19 NOTE — Telephone Encounter (Signed)
vyvanse rx ready for pick up  

## 2014-07-19 NOTE — Telephone Encounter (Signed)
Patient aware that rx is up front to be picked up. 

## 2014-08-17 ENCOUNTER — Telehealth: Payer: Self-pay | Admitting: Nurse Practitioner

## 2014-08-17 NOTE — Telephone Encounter (Signed)
appt rescheduled.

## 2014-08-17 NOTE — Telephone Encounter (Signed)
Pt wants to change ADD meds appt scheduled with MMM

## 2014-08-23 ENCOUNTER — Ambulatory Visit: Payer: BLUE CROSS/BLUE SHIELD | Admitting: Nurse Practitioner

## 2014-08-31 ENCOUNTER — Encounter: Payer: Self-pay | Admitting: Nurse Practitioner

## 2014-08-31 ENCOUNTER — Ambulatory Visit (INDEPENDENT_AMBULATORY_CARE_PROVIDER_SITE_OTHER): Payer: BLUE CROSS/BLUE SHIELD | Admitting: Nurse Practitioner

## 2014-08-31 VITALS — BP 119/79 | HR 81 | Temp 97.1°F | Ht 67.0 in | Wt 133.0 lb

## 2014-08-31 DIAGNOSIS — F900 Attention-deficit hyperactivity disorder, predominantly inattentive type: Secondary | ICD-10-CM

## 2014-08-31 DIAGNOSIS — F909 Attention-deficit hyperactivity disorder, unspecified type: Secondary | ICD-10-CM

## 2014-08-31 MED ORDER — LISDEXAMFETAMINE DIMESYLATE 50 MG PO CAPS: 50.0000 mg | ORAL_CAPSULE | ORAL | Status: DC

## 2014-08-31 NOTE — Patient Instructions (Signed)
Stress and Stress Management Stress is a normal reaction to life events. It is what you feel when life demands more than you are used to or more than you can handle. Some stress can be useful. For example, the stress reaction can help you catch the last bus of the day, study for a test, or meet a deadline at work. But stress that occurs too often or for too long can cause problems. It can affect your emotional health and interfere with relationships and normal daily activities. Too much stress can weaken your immune system and increase your risk for physical illness. If you already have a medical problem, stress can make it worse. CAUSES  All sorts of life events may cause stress. An event that causes stress for one person may not be stressful for another person. Major life events commonly cause stress. These may be positive or negative. Examples include losing your job, moving into a new home, getting married, having a baby, or losing a loved one. Less obvious life events may also cause stress, especially if they occur day after day or in combination. Examples include working long hours, driving in traffic, caring for children, being in debt, or being in a difficult relationship. SIGNS AND SYMPTOMS Stress may cause emotional symptoms including, the following:  Anxiety. This is feeling worried, afraid, on edge, overwhelmed, or out of control.  Anger. This is feeling irritated or impatient.  Depression. This is feeling sad, down, helpless, or guilty.  Difficulty focusing, remembering, or making decisions. Stress may cause physical symptoms, including the following:   Aches and pains. These may affect your head, neck, back, stomach, or other areas of your body.  Tight muscles or clenched jaw.  Low energy or trouble sleeping. Stress may cause unhealthy behaviors, including the following:   Eating to feel better (overeating) or skipping meals.  Sleeping too little, too much, or both.  Working  too much or putting off tasks (procrastination).  Smoking, drinking alcohol, or using drugs to feel better. DIAGNOSIS  Stress is diagnosed through an assessment by your health care provider. Your health care provider will ask questions about your symptoms and any stressful life events.Your health care provider will also ask about your medical history and may order blood tests or other tests. Certain medical conditions and medicine can cause physical symptoms similar to stress. Mental illness can cause emotional symptoms and unhealthy behaviors similar to stress. Your health care provider may refer you to a mental health professional for further evaluation.  TREATMENT  Stress management is the recommended treatment for stress.The goals of stress management are reducing stressful life events and coping with stress in healthy ways.  Techniques for reducing stressful life events include the following:  Stress identification. Self-monitor for stress and identify what causes stress for you. These skills may help you to avoid some stressful events.  Time management. Set your priorities, keep a calendar of events, and learn to say "no." These tools can help you avoid making too many commitments. Techniques for coping with stress include the following:  Rethinking the problem. Try to think realistically about stressful events rather than ignoring them or overreacting. Try to find the positives in a stressful situation rather than focusing on the negatives.  Exercise. Physical exercise can release both physical and emotional tension. The key is to find a form of exercise you enjoy and do it regularly.  Relaxation techniques. These relax the body and mind. Examples include yoga, meditation, tai chi, biofeedback, deep  breathing, progressive muscle relaxation, listening to music, being out in nature, journaling, and other hobbies. Again, the key is to find one or more that you enjoy and can do  regularly.  Healthy lifestyle. Eat a balanced diet, get plenty of sleep, and do not smoke. Avoid using alcohol or drugs to relax.  Strong support network. Spend time with family, friends, or other people you enjoy being around.Express your feelings and talk things over with someone you trust. Counseling or talktherapy with a mental health professional may be helpful if you are having difficulty managing stress on your own. Medicine is typically not recommended for the treatment of stress.Talk to your health care provider if you think you need medicine for symptoms of stress. HOME CARE INSTRUCTIONS  Keep all follow-up visits as directed by your health care provider.  Take all medicines as directed by your health care provider. SEEK MEDICAL CARE IF:  Your symptoms get worse or you start having new symptoms.  You feel overwhelmed by your problems and can no longer manage them on your own. SEEK IMMEDIATE MEDICAL CARE IF:  You feel like hurting yourself or someone else. Document Released: 08/08/2000 Document Revised: 06/29/2013 Document Reviewed: 10/07/2012 [employer] Patient Information 2015 [employer]. This information is not intended to replace advice given to you by your health care provider. Make sure you discuss any questions you have with your health care provider.  

## 2014-08-31 NOTE — Progress Notes (Addendum)
   Subjective:    Patient ID: Valerie Cole, female    DOB: 16-Dec-1970, 44 y.o.   MRN: [id number]  HPI Patient brought in today by self for follow up of ADD. Currently taking vyvanse 50mg  daily. Behavior- good Concentration- good Medication side effects- none Weight loss-none Sleeping habits-good Any concerns- weight loss- not due to meds though- having rough time at home.     Review of Systems  Constitutional: Negative.   HENT: Negative.   Respiratory: Negative.   Cardiovascular: Negative.   Gastrointestinal: Negative.   Genitourinary: Negative.   Neurological: Negative.   Psychiatric/Behavioral: Negative.   All other systems reviewed and are negative.      Objective:   Physical Exam  Constitutional: She appears well-developed and well-nourished.  Cardiovascular: Normal rate, regular rhythm and normal heart sounds.   Pulmonary/Chest: Effort normal and breath sounds normal.  Skin: Skin is warm.  Psychiatric: She has a normal mood and affect. Her behavior is normal. Judgment and thought content normal.    BP 119/79 mmHg  Pulse 81  Temp(Src) 97.1 F (36.2 C) (Oral)  Ht 5\' 7"  (1.702 m)  Wt 133 lb (60.328 kg)  BMI 20.83 kg/m2  LMP 08/23/2014       Assessment & Plan:  1. Adult ADHD Stress management - lisdexamfetamine (VYVANSE) 50 MG capsule; Take 1 capsule (50 mg total) by mouth every morning.  Dispense: 30 capsule; Refill: 0 - lisdexamfetamine (VYVANSE) 50 MG capsule; Take 1 capsule (50 mg total) by mouth every morning.  Dispense: 30 capsule; Refill: 0 adhd medication contract signed  Mary-Margaret Daphine DeutscherMartin, FNP

## 2014-11-10 ENCOUNTER — Other Ambulatory Visit: Payer: Self-pay | Admitting: Nurse Practitioner

## 2014-11-10 DIAGNOSIS — F909 Attention-deficit hyperactivity disorder, unspecified type: Secondary | ICD-10-CM

## 2014-11-10 MED ORDER — LISDEXAMFETAMINE DIMESYLATE 50 MG PO CAPS: 50.0000 mg | ORAL_CAPSULE | ORAL | Status: DC

## 2014-11-10 NOTE — Telephone Encounter (Signed)
Last filled 09/30/14, last seen 08/31/14

## 2014-11-10 NOTE — Telephone Encounter (Signed)
vyvanse rx ready for pick up  

## 2014-11-10 NOTE — Telephone Encounter (Signed)
Rx up front. Patient notified 

## 2014-12-14 ENCOUNTER — Telehealth: Payer: Self-pay | Admitting: Nurse Practitioner

## 2014-12-14 DIAGNOSIS — F909 Attention-deficit hyperactivity disorder, unspecified type: Secondary | ICD-10-CM

## 2014-12-14 MED ORDER — LISDEXAMFETAMINE DIMESYLATE 50 MG PO CAPS: 50.0000 mg | ORAL_CAPSULE | ORAL | Status: DC

## 2014-12-14 NOTE — Telephone Encounter (Signed)
vyvanse rx ready for pick up  

## 2015-01-24 ENCOUNTER — Telehealth: Payer: Self-pay | Admitting: Nurse Practitioner

## 2015-01-24 DIAGNOSIS — F909 Attention-deficit hyperactivity disorder, unspecified type: Secondary | ICD-10-CM

## 2015-01-24 MED ORDER — LISDEXAMFETAMINE DIMESYLATE 50 MG PO CAPS: 50.0000 mg | ORAL_CAPSULE | ORAL | Status: DC

## 2015-01-24 NOTE — Telephone Encounter (Signed)
vyvanse rx ready for pick up- Last refill without being seen   

## 2015-01-24 NOTE — Telephone Encounter (Signed)
Patient aware and verbalizes understanding. 

## 2015-02-14 ENCOUNTER — Ambulatory Visit: Payer: BLUE CROSS/BLUE SHIELD | Admitting: Nurse Practitioner

## 2015-02-15 ENCOUNTER — Encounter: Payer: Self-pay | Admitting: Nurse Practitioner

## 2015-02-23 ENCOUNTER — Ambulatory Visit (INDEPENDENT_AMBULATORY_CARE_PROVIDER_SITE_OTHER): Payer: BLUE CROSS/BLUE SHIELD

## 2015-02-23 DIAGNOSIS — Z111 Encounter for screening for respiratory tuberculosis: Secondary | ICD-10-CM

## 2015-02-25 ENCOUNTER — Encounter: Payer: Self-pay | Admitting: Family

## 2015-02-25 ENCOUNTER — Other Ambulatory Visit: Payer: Self-pay | Admitting: Family

## 2015-02-25 ENCOUNTER — Ambulatory Visit (INDEPENDENT_AMBULATORY_CARE_PROVIDER_SITE_OTHER): Payer: BLUE CROSS/BLUE SHIELD | Admitting: Family

## 2015-02-25 VITALS — BP 112/73 | HR 97 | Temp 98.0°F | Ht 67.0 in | Wt 135.0 lb

## 2015-02-25 DIAGNOSIS — M545 Low back pain: Secondary | ICD-10-CM | POA: Diagnosis not present

## 2015-02-25 DIAGNOSIS — Z202 Contact with and (suspected) exposure to infections with a predominantly sexual mode of transmission: Secondary | ICD-10-CM | POA: Diagnosis not present

## 2015-02-25 DIAGNOSIS — Z0000 Encounter for general adult medical examination without abnormal findings: Secondary | ICD-10-CM

## 2015-02-25 DIAGNOSIS — F909 Attention-deficit hyperactivity disorder, unspecified type: Secondary | ICD-10-CM

## 2015-02-25 DIAGNOSIS — J309 Allergic rhinitis, unspecified: Secondary | ICD-10-CM

## 2015-02-25 LAB — POCT UA - MICROSCOPIC ONLY
CASTS, UR, LPF, POC: NEGATIVE
Crystals, Ur, HPF, POC: NEGATIVE
YEAST UA: NEGATIVE

## 2015-02-25 LAB — POCT URINALYSIS DIPSTICK
Bilirubin, UA: NEGATIVE
GLUCOSE UA: NEGATIVE
Ketones, UA: NEGATIVE
Leukocytes, UA: NEGATIVE
NITRITE UA: NEGATIVE
PH UA: 6
PROTEIN UA: NEGATIVE
RBC UA: NEGATIVE
UROBILINOGEN UA: NEGATIVE

## 2015-02-25 LAB — TB SKIN TEST
INDURATION: 0 mm
TB SKIN TEST: NEGATIVE

## 2015-02-25 MED ORDER — LISDEXAMFETAMINE DIMESYLATE 50 MG PO CAPS: 50.0000 mg | ORAL_CAPSULE | ORAL | Status: DC

## 2015-02-25 MED ORDER — MOMETASONE FUROATE 50 MCG/ACT NA SUSP: 2.0000 | Freq: Every day | NASAL | Status: DC

## 2015-02-25 MED ORDER — SULFAMETHOXAZOLE-TRIMETHOPRIM 800-160 MG PO TABS: 1.0000 | ORAL_TABLET | Freq: Two times a day (BID) | ORAL | Status: DC

## 2015-02-25 NOTE — Patient Instructions (Signed)
Health Maintenance, Female Adopting a healthy lifestyle and getting preventive care can go a long way to promote health and wellness. Talk with your health care provider about what schedule of regular examinations is right for you. This is a good chance for you to check in with your provider about disease prevention and staying healthy. In between checkups, there are plenty of things you can do on your own. Experts have done a lot of research about which lifestyle changes and preventive measures are most likely to keep you healthy. Ask your health care provider for more information. WEIGHT AND DIET  Eat a healthy diet  Be sure to include plenty of vegetables, fruits, low-fat dairy products, and lean protein.  Do not eat a lot of foods high in solid fats, added sugars, or salt.  Get regular exercise. This is one of the most important things you can do for your health.  Most adults should exercise for at least 150 minutes each week. The exercise should increase your heart rate and make you sweat (moderate-intensity exercise).  Most adults should also do strengthening exercises at least twice a week. This is in addition to the moderate-intensity exercise.  Maintain a healthy weight  Body mass index (BMI) is a measurement that can be used to identify possible weight problems. It estimates body fat based on height and weight. Your health care provider can help determine your BMI and help you achieve or maintain a healthy weight.  For females 20 years of age and older:   A BMI below 18.5 is considered underweight.  A BMI of 18.5 to 24.9 is normal.  A BMI of 25 to 29.9 is considered overweight.  A BMI of 30 and above is considered obese.  Watch levels of cholesterol and blood lipids  You should start having your blood tested for lipids and cholesterol at 42 years of age, then have this test every 5 years.  You may need to have your cholesterol levels checked more often if:  Your lipid  or cholesterol levels are high.  You are older than 42 years of age.  You are at high risk for heart disease.  CANCER SCREENING   Lung Cancer  Lung cancer screening is recommended for adults 55-80 years old who are at high risk for lung cancer because of a history of smoking.  A yearly low-dose CT scan of the lungs is recommended for people who:  Currently smoke.  Have quit within the past 15 years.  Have at least a 30-pack-year history of smoking. A pack year is smoking an average of one pack of cigarettes a day for 1 year.  Yearly screening should continue until it has been 15 years since you quit.  Yearly screening should stop if you develop a health problem that would prevent you from having lung cancer treatment.  Breast Cancer  Practice breast self-awareness. This means understanding how your breasts normally appear and feel.  It also means doing regular breast self-exams. Let your health care provider know about any changes, no matter how small.  If you are in your 20s or 30s, you should have a clinical breast exam (CBE) by a health care provider every 1-3 years as part of a regular health exam.  If you are 40 or older, have a CBE every year. Also consider having a breast X-ray (mammogram) every year.  If you have a family history of breast cancer, talk to your health care provider about genetic screening.  If you   are at high risk for breast cancer, talk to your health care provider about having an MRI and a mammogram every year.  Breast cancer gene (BRCA) assessment is recommended for women who have family members with BRCA-related cancers. BRCA-related cancers include:  Breast.  Ovarian.  Tubal.  Peritoneal cancers.  Results of the assessment will determine the need for genetic counseling and BRCA1 and BRCA2 testing. Cervical Cancer Your health care provider may recommend that you be screened regularly for cancer of the pelvic organs (ovaries, uterus, and  vagina). This screening involves a pelvic examination, including checking for microscopic changes to the surface of your cervix (Pap test). You may be encouraged to have this screening done every 3 years, beginning at age 21.  For women ages 30-65, health care providers may recommend pelvic exams and Pap testing every 3 years, or they may recommend the Pap and pelvic exam, combined with testing for human papilloma virus (HPV), every 5 years. Some types of HPV increase your risk of cervical cancer. Testing for HPV may also be done on women of any age with unclear Pap test results.  Other health care providers may not recommend any screening for nonpregnant women who are considered low risk for pelvic cancer and who do not have symptoms. Ask your health care provider if a screening pelvic exam is right for you.  If you have had past treatment for cervical cancer or a condition that could lead to cancer, you need Pap tests and screening for cancer for at least 20 years after your treatment. If Pap tests have been discontinued, your risk factors (such as having a new sexual partner) need to be reassessed to determine if screening should resume. Some women have medical problems that increase the chance of getting cervical cancer. In these cases, your health care provider may recommend more frequent screening and Pap tests. Colorectal Cancer  This type of cancer can be detected and often prevented.  Routine colorectal cancer screening usually begins at 42 years of age and continues through 42 years of age.  Your health care provider may recommend screening at an earlier age if you have risk factors for colon cancer.  Your health care provider may also recommend using home test kits to check for hidden blood in the stool.  A small camera at the end of a tube can be used to examine your colon directly (sigmoidoscopy or colonoscopy). This is done to check for the earliest forms of colorectal  cancer.  Routine screening usually begins at age 50.  Direct examination of the colon should be repeated every 5-10 years through 42 years of age. However, you may need to be screened more often if early forms of precancerous polyps or small growths are found. Skin Cancer  Check your skin from head to toe regularly.  Tell your health care provider about any new moles or changes in moles, especially if there is a change in a mole's shape or color.  Also tell your health care provider if you have a mole that is larger than the size of a pencil eraser.  Always use sunscreen. Apply sunscreen liberally and repeatedly throughout the day.  Protect yourself by wearing long sleeves, pants, a wide-brimmed hat, and sunglasses whenever you are outside. HEART DISEASE, DIABETES, AND HIGH BLOOD PRESSURE   High blood pressure causes heart disease and increases the risk of stroke. High blood pressure is more likely to develop in:  People who have blood pressure in the high end   of the normal range (130-139/85-89 mm Hg).  People who are overweight or obese.  People who are African American.  If you are 38-23 years of age, have your blood pressure checked every 3-5 years. If you are 61 years of age or older, have your blood pressure checked every year. You should have your blood pressure measured twice--once when you are at a hospital or clinic, and once when you are not at a hospital or clinic. Record the average of the two measurements. To check your blood pressure when you are not at a hospital or clinic, you can use:  An automated blood pressure machine at a pharmacy.  A home blood pressure monitor.  If you are between 45 years and 39 years old, ask your health care provider if you should take aspirin to prevent strokes.  Have regular diabetes screenings. This involves taking a blood sample to check your fasting blood sugar level.  If you are at a normal weight and have a low risk for diabetes,  have this test once every three years after 42 years of age.  If you are overweight and have a high risk for diabetes, consider being tested at a younger age or more often. PREVENTING INFECTION  Hepatitis B  If you have a higher risk for hepatitis B, you should be screened for this virus. You are considered at high risk for hepatitis B if:  You were born in a country where hepatitis B is common. Ask your health care provider which countries are considered high risk.  Your parents were born in a high-risk country, and you have not been immunized against hepatitis B (hepatitis B vaccine).  You have HIV or AIDS.  You use needles to inject street drugs.  You live with someone who has hepatitis B.  You have had sex with someone who has hepatitis B.  You get hemodialysis treatment.  You take certain medicines for conditions, including cancer, organ transplantation, and autoimmune conditions. Hepatitis C  Blood testing is recommended for:  Everyone born from [birth year] through 1963.  Anyone with known risk factors for hepatitis C. Sexually transmitted infections (STIs)  You should be screened for sexually transmitted infections (STIs) including gonorrhea and chlamydia if:  You are sexually active and are younger than 42 years of age.  You are older than 42 years of age and your health care provider tells you that you are at risk for this type of infection.  Your sexual activity has changed since you were last screened and you are at an increased risk for chlamydia or gonorrhea. Ask your health care provider if you are at risk.  If you do not have HIV, but are at risk, it may be recommended that you take a prescription medicine daily to prevent HIV infection. This is called pre-exposure prophylaxis (PrEP). You are considered at risk if:  You are sexually active and do not regularly use condoms or know the HIV status of your partner(s).  You take drugs by injection.  You are sexually  active with a partner who has HIV. Talk with your health care provider about whether you are at high risk of being infected with HIV. If you choose to begin PrEP, you should first be tested for HIV. You should then be tested every 3 months for as long as you are taking PrEP.  PREGNANCY   If you are premenopausal and you may become pregnant, ask your health care provider about preconception counseling.  If you may  become pregnant, take 400 to 800 micrograms (mcg) of folic acid every day.  If you want to prevent pregnancy, talk to your health care provider about birth control (contraception). OSTEOPOROSIS AND MENOPAUSE   Osteoporosis is a disease in which the bones lose minerals and strength with aging. This can result in serious bone fractures. Your risk for osteoporosis can be identified using a bone density scan.  If you are 61 years of age or older, or if you are at risk for osteoporosis and fractures, ask your health care provider if you should be screened.  Ask your health care provider whether you should take a calcium or vitamin D supplement to lower your risk for osteoporosis.  Menopause may have certain physical symptoms and risks.  Hormone replacement therapy may reduce some of these symptoms and risks. Talk to your health care provider about whether hormone replacement therapy is right for you.  HOME CARE INSTRUCTIONS   Schedule regular health, dental, and eye exams.  Stay current with your immunizations.   Do not use any tobacco products including cigarettes, chewing tobacco, or electronic cigarettes.  If you are pregnant, do not drink alcohol.  If you are breastfeeding, limit how much and how often you drink alcohol.  Limit alcohol intake to no more than 1 drink per day for nonpregnant women. One drink equals 12 ounces of beer, 5 ounces of wine, or 1 ounces of hard liquor.  Do not use street drugs.  Do not share needles.  Ask your health care provider for help if  you need support or information about quitting drugs.  Tell your health care provider if you often feel depressed.  Tell your health care provider if you have ever been abused or do not feel safe at home.   This information is not intended to replace advice given to you by your health care provider. Make sure you discuss any questions you have with your health care provider.   Document Released: 08/28/2010 Document Revised: 03/05/2014 Document Reviewed: 01/14/2013 Elsevier Interactive Patient Education [employer].

## 2015-02-25 NOTE — Progress Notes (Signed)
Subjective:    Patient ID: Valerie Cole, female    DOB: Nov 10, 1970, 44 y.o.   MRN: [id number]  PT presents to the office today for CPE without pap. 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 PT currently taking Vyvanse 50 mg daily for Adult ADHD. Pt reports this working well with no complaints at this time.  Back Pain This is a new problem. The current episode started in the past 7 days. The problem occurs intermittently. The problem is unchanged. The pain is present in the lumbar spine. The quality of the pain is described as aching and shooting. The pain is at a severity of 7/10. The pain is moderate. Pertinent negatives include no bladder incontinence, bowel incontinence, dysuria, headaches, paresis or weakness. Risk factors: History of kidney stones. She has tried nothing for the symptoms. The treatment provided no relief.      Review of Systems  Constitutional: Negative.   HENT: Negative.   Eyes: Negative.   Respiratory: Negative.  Negative for shortness of breath.   Cardiovascular: Negative.  Negative for palpitations.  Gastrointestinal: Negative.  Negative for bowel incontinence.  Endocrine: Negative.   Genitourinary: Negative.  Negative for bladder incontinence and dysuria.  Musculoskeletal: Positive for back pain.  Neurological: Negative.  Negative for weakness and headaches.  Hematological: Negative.   Psychiatric/Behavioral: Negative.   All other systems reviewed and are negative.      Objective:   Physical Exam  Constitutional: She is oriented to person, place, and time. She appears well-developed and well-nourished. No distress.  HENT:  Head: Normocephalic and atraumatic.  Right Ear: External ear normal.  Left Ear: External ear normal.  Mouth/Throat: Oropharynx is clear and moist.  Eyes: Pupils are equal, round, and reactive to light.  Neck: Normal range of motion. Neck supple. No thyromegaly present.  Cardiovascular: Normal rate, regular  rhythm, normal heart sounds and intact distal pulses.   No murmur heard. Pulmonary/Chest: Effort normal and breath sounds normal. No respiratory distress. She has no wheezes.  Abdominal: Soft. Bowel sounds are normal. She exhibits no distension. There is no tenderness.  Musculoskeletal: Normal range of motion. She exhibits no edema or tenderness.  Neurological: She is alert and oriented to person, place, and time. She has normal reflexes. No cranial nerve deficit.  Skin: Skin is warm and dry.  Psychiatric: She has a normal mood and affect. Her behavior is normal. Judgment and thought content normal.  Vitals reviewed.     BP 112/73 mmHg  Pulse 97  Temp(Src) 98 F (36.7 C) (Oral)  Ht '5\' 7"'$  (1.702 m)  Wt 135 lb (61.236 kg)  BMI 21.14 kg/m2     Assessment & Plan:  1. Adult ADHD - lisdexamfetamine (VYVANSE) 50 MG capsule; Take 1 capsule (50 mg total) by mouth every morning.  Dispense: 30 capsule; Refill: 0 - lisdexamfetamine (VYVANSE) 50 MG capsule; Take 1 capsule (50 mg total) by mouth every morning.  Dispense: 30 capsule; Refill: 0 - CMP14+EGFR  2. Bilateral low back pain, with sciatica presence unspecified - POCT UA - Microscopic Only - POCT urinalysis dipstick - CMP14+EGFR  3. Annual physical exam  - Anemia Profile B - CMP14+EGFR - Lipid panel - Thyroid Panel With TSH - VITAMIN D 25 Hydroxy (Vit-D Deficiency, Fractures) - STD Screen (8)  4. Possible exposure to STD  - CMP14+EGFR - STD Screen (8)  5. Allergic rhinitis, unspecified allergic rhinitis type  - mometasone (NASONEX) 50 MCG/ACT nasal spray; Place 2  sprays into the nose daily.  Dispense: 17 g; Refill: 12   Continue all meds Labs pending Health Maintenance reviewed Diet and exercise encouraged RTO 1 year and keep chronic follow up with Shelah Lewandowsky FNP for ADHD  Evelina Dun, FNP

## 2015-02-26 LAB — CMP14+EGFR
A/G RATIO: 1.8 (ref 1.1–2.5)
ALK PHOS: 55 IU/L (ref 39–117)
ALT: 9 IU/L (ref 0–32)
AST: 14 IU/L (ref 0–40)
Albumin: 4.3 g/dL (ref 3.5–5.5)
BILIRUBIN TOTAL: 0.6 mg/dL (ref 0.0–1.2)
BUN/Creatinine Ratio: 18 (ref 9–23)
BUN: 15 mg/dL (ref 6–24)
CHLORIDE: 106 mmol/L (ref 96–106)
CO2: 24 mmol/L (ref 18–29)
Calcium: 9.2 mg/dL (ref 8.7–10.2)
Creatinine, Ser: 0.84 mg/dL (ref 0.57–1.00)
GFR calc Af Amer: 99 mL/min/{1.73_m2} (ref >59)
GFR calc non Af Amer: 86 mL/min/{1.73_m2} (ref >59)
GLUCOSE: 93 mg/dL (ref 65–99)
Globulin, Total: 2.4 g/dL (ref 1.5–4.5)
POTASSIUM: 4.6 mmol/L (ref 3.5–5.2)
Sodium: 142 mmol/L (ref 134–144)
TOTAL PROTEIN: 6.7 g/dL (ref 6.0–8.5)

## 2015-02-26 LAB — ANEMIA PROFILE B
BASOS ABS: 0 10*3/uL (ref 0.0–0.2)
Basos: 0 %
EOS (ABSOLUTE): 0.1 10*3/uL (ref 0.0–0.4)
Eos: 1 %
FERRITIN: 40 ng/mL (ref 15–150)
Folate: 10.8 ng/mL (ref >3.0)
Hematocrit: 37.7 % (ref 34.0–46.6)
Hemoglobin: 12.6 g/dL (ref 11.1–15.9)
Immature Grans (Abs): 0 10*3/uL (ref 0.0–0.1)
Immature Granulocytes: 0 %
Iron Saturation: 26 % (ref 15–55)
Iron: 85 ug/dL (ref 27–159)
LYMPHS: 28 %
Lymphocytes Absolute: 1.8 10*3/uL (ref 0.7–3.1)
MCH: 29.1 pg (ref 26.6–33.0)
MCHC: 33.4 g/dL (ref 31.5–35.7)
MCV: 87 fL (ref 79–97)
MONOCYTES: 4 %
MONOS ABS: 0.3 10*3/uL (ref 0.1–0.9)
NEUTROS ABS: 4.4 10*3/uL (ref 1.4–7.0)
Neutrophils: 67 %
PLATELETS: 236 10*3/uL (ref 150–379)
RBC: 4.33 x10E6/uL (ref 3.77–5.28)
RDW: 13 % (ref 12.3–15.4)
RETIC CT PCT: 0.4 % — AB (ref 0.6–2.6)
Total Iron Binding Capacity: 326 ug/dL (ref 250–450)
UIBC: 241 ug/dL (ref 131–425)
VITAMIN B 12: 375 pg/mL (ref 211–946)
WBC: 6.6 10*3/uL (ref 3.4–10.8)

## 2015-02-26 LAB — STD SCREEN (8)
HEP A IGM: NEGATIVE
HEP B C IGM: NEGATIVE
HIV Screen 4th Generation wRfx: NONREACTIVE
HSV 2 Glycoprotein G Ab, IgG: <0.91 index (ref 0.00–0.90)
Hepatitis B Surface Ag: NEGATIVE
RPR Ser Ql: NONREACTIVE

## 2015-02-26 LAB — LIPID PANEL
CHOLESTEROL TOTAL: 135 mg/dL (ref 100–199)
Chol/HDL Ratio: 2.1 ratio units (ref 0.0–4.4)
HDL: 64 mg/dL (ref >39)
LDL Calculated: 62 mg/dL (ref 0–99)
TRIGLYCERIDES: 44 mg/dL (ref 0–149)
VLDL CHOLESTEROL CAL: 9 mg/dL (ref 5–40)

## 2015-02-26 LAB — THYROID PANEL WITH TSH
FREE THYROXINE INDEX: 2.5 (ref 1.2–4.9)
T3 Uptake Ratio: 32 % (ref 24–39)
T4, Total: 7.8 ug/dL (ref 4.5–12.0)
TSH: 0.584 u[IU]/mL (ref 0.450–4.500)

## 2015-02-26 LAB — VITAMIN D 25 HYDROXY (VIT D DEFICIENCY, FRACTURES): VIT D 25 HYDROXY: 28.5 ng/mL — AB (ref 30.0–100.0)

## 2015-03-02 ENCOUNTER — Other Ambulatory Visit: Payer: Self-pay | Admitting: Family

## 2015-03-02 ENCOUNTER — Ambulatory Visit: Payer: BLUE CROSS/BLUE SHIELD | Admitting: Nurse Practitioner

## 2015-03-02 DIAGNOSIS — E559 Vitamin D deficiency, unspecified: Secondary | ICD-10-CM | POA: Insufficient documentation

## 2015-03-02 MED ORDER — VITAMIN D (ERGOCALCIFEROL) 1.25 MG (50000 UNIT) PO CAPS
50000.0000 [IU] | ORAL_CAPSULE | ORAL | Status: DC
Start: 2015-03-02 — End: 2015-07-19

## 2015-03-08 ENCOUNTER — Telehealth: Payer: Self-pay | Admitting: Family

## 2015-03-08 MED ORDER — FLUCONAZOLE 150 MG PO TABS
ORAL_TABLET | ORAL | Status: DC
Start: 2015-03-08 — End: 2015-07-19

## 2015-03-08 NOTE — Telephone Encounter (Signed)
Stp and she is aware rx sent to pharmacy.

## 2015-05-11 ENCOUNTER — Other Ambulatory Visit: Payer: Self-pay | Admitting: Nurse Practitioner

## 2015-05-11 DIAGNOSIS — F909 Attention-deficit hyperactivity disorder, unspecified type: Secondary | ICD-10-CM

## 2015-05-11 MED ORDER — LISDEXAMFETAMINE DIMESYLATE 50 MG PO CAPS: 50.0000 mg | ORAL_CAPSULE | ORAL | Status: DC

## 2015-05-11 NOTE — Telephone Encounter (Signed)
Pt needs follow up appt. RX ready for pick up

## 2015-05-11 NOTE — Telephone Encounter (Signed)
Patient aware that rx is ready to be picked up after 4.

## 2015-05-11 NOTE — Telephone Encounter (Signed)
Last seen 02/25/15, no follow up scheduled.

## 2015-07-19 ENCOUNTER — Encounter: Payer: Self-pay | Admitting: *Deleted

## 2015-07-19 ENCOUNTER — Encounter (INDEPENDENT_AMBULATORY_CARE_PROVIDER_SITE_OTHER): Payer: Self-pay

## 2015-07-19 ENCOUNTER — Ambulatory Visit (INDEPENDENT_AMBULATORY_CARE_PROVIDER_SITE_OTHER): Payer: BLUE CROSS/BLUE SHIELD | Admitting: Nurse Practitioner

## 2015-07-19 ENCOUNTER — Encounter: Payer: Self-pay | Admitting: Nurse Practitioner

## 2015-07-19 VITALS — BP 99/62 | HR 77 | Temp 96.8°F | Ht 67.0 in | Wt 145.0 lb

## 2015-07-19 DIAGNOSIS — F900 Attention-deficit hyperactivity disorder, predominantly inattentive type: Secondary | ICD-10-CM | POA: Diagnosis not present

## 2015-07-19 DIAGNOSIS — G4700 Insomnia, unspecified: Secondary | ICD-10-CM | POA: Diagnosis not present

## 2015-07-19 DIAGNOSIS — F909 Attention-deficit hyperactivity disorder, unspecified type: Secondary | ICD-10-CM

## 2015-07-19 MED ORDER — LISDEXAMFETAMINE DIMESYLATE 50 MG PO CAPS: 50.0000 mg | ORAL_CAPSULE | ORAL | Status: DC

## 2015-07-19 NOTE — Patient Instructions (Signed)
Stress and Stress Management Stress is a normal reaction to life events. It is what you feel when life demands more than you are used to or more than you can handle. Some stress can be useful. For example, the stress reaction can help you catch the last bus of the day, study for a test, or meet a deadline at work. But stress that occurs too often or for too long can cause problems. It can affect your emotional health and interfere with relationships and normal daily activities. Too much stress can weaken your immune system and increase your risk for physical illness. If you already have a medical problem, stress can make it worse. CAUSES  All sorts of life events may cause stress. An event that causes stress for one person may not be stressful for another person. Major life events commonly cause stress. These may be positive or negative. Examples include losing your job, moving into a new home, getting married, having a baby, or losing a loved one. Less obvious life events may also cause stress, especially if they occur day after day or in combination. Examples include working long hours, driving in traffic, caring for children, being in debt, or being in a difficult relationship. SIGNS AND SYMPTOMS Stress may cause emotional symptoms including, the following:  Anxiety. This is feeling worried, afraid, on edge, overwhelmed, or out of control.  Anger. This is feeling irritated or impatient.  Depression. This is feeling sad, down, helpless, or guilty.  Difficulty focusing, remembering, or making decisions. Stress may cause physical symptoms, including the following:   Aches and pains. These may affect your head, neck, back, stomach, or other areas of your body.  Tight muscles or clenched jaw.  Low energy or trouble sleeping. Stress may cause unhealthy behaviors, including the following:   Eating to feel better (overeating) or skipping meals.  Sleeping too little, too much, or both.  Working  too much or putting off tasks (procrastination).  Smoking, drinking alcohol, or using drugs to feel better. DIAGNOSIS  Stress is diagnosed through an assessment by your health care provider. Your health care provider will ask questions about your symptoms and any stressful life events.Your health care provider will also ask about your medical history and may order blood tests or other tests. Certain medical conditions and medicine can cause physical symptoms similar to stress. Mental illness can cause emotional symptoms and unhealthy behaviors similar to stress. Your health care provider may refer you to a mental health professional for further evaluation.  TREATMENT  Stress management is the recommended treatment for stress.The goals of stress management are reducing stressful life events and coping with stress in healthy ways.  Techniques for reducing stressful life events include the following:  Stress identification. Self-monitor for stress and identify what causes stress for you. These skills may help you to avoid some stressful events.  Time management. Set your priorities, keep a calendar of events, and learn to say "no." These tools can help you avoid making too many commitments. Techniques for coping with stress include the following:  Rethinking the problem. Try to think realistically about stressful events rather than ignoring them or overreacting. Try to find the positives in a stressful situation rather than focusing on the negatives.  Exercise. Physical exercise can release both physical and emotional tension. The key is to find a form of exercise you enjoy and do it regularly.  Relaxation techniques. These relax the body and mind. Examples include yoga, meditation, tai chi, biofeedback, deep  breathing, progressive muscle relaxation, listening to music, being out in nature, journaling, and other hobbies. Again, the key is to find one or more that you enjoy and can do  regularly.  Healthy lifestyle. Eat a balanced diet, get plenty of sleep, and do not smoke. Avoid using alcohol or drugs to relax.  Strong support network. Spend time with family, friends, or other people you enjoy being around.Express your feelings and talk things over with someone you trust. Counseling or talktherapy with a mental health professional may be helpful if you are having difficulty managing stress on your own. Medicine is typically not recommended for the treatment of stress.Talk to your health care provider if you think you need medicine for symptoms of stress. HOME CARE INSTRUCTIONS  Keep all follow-up visits as directed by your health care provider.  Take all medicines as directed by your health care provider. SEEK MEDICAL CARE IF:  Your symptoms get worse or you start having new symptoms.  You feel overwhelmed by your problems and can no longer manage them on your own. SEEK IMMEDIATE MEDICAL CARE IF:  You feel like hurting yourself or someone else.   This information is not intended to replace advice given to you by your health care provider. Make sure you discuss any questions you have with your health care provider.   Document Released: 08/08/2000 Document Revised: 03/05/2014 Document Reviewed: 10/07/2012 Elsevier Interactive Patient Education 2016 [employer]  

## 2015-07-19 NOTE — Progress Notes (Signed)
   Subjective:    Patient ID: Valerie PolingAmy O Cole, female    DOB: August 28, 1970, 45 y.o.   MRN: [id number]  HPI Patient in today for adult ADHD follow up- she has been on medicine for many years and is unable to concentrate on work at all without taking meds- She is currently on vyvanse 50mg  daily and is doing quite well. She denies any medication side effects. No weight loss- no complications with sleep pattern * Patient is moving to [place] when she can sale her house   Review of Systems  Constitutional: Negative.   HENT: Negative.   Respiratory: Negative.   Cardiovascular: Negative.   Gastrointestinal: Negative.   Genitourinary: Negative.   Skin: Negative.   Neurological: Negative.   Psychiatric/Behavioral: Negative.   All other systems reviewed and are negative.      Objective:   Physical Exam  Constitutional: She is oriented to person, place, and time. She appears well-developed and well-nourished. No distress.  Neck: Normal range of motion.  Cardiovascular: Normal rate, regular rhythm and normal heart sounds.   Pulmonary/Chest: Effort normal and breath sounds normal.  Neurological: She is alert and oriented to person, place, and time.  Skin: Skin is warm.  Psychiatric: She has a normal mood and affect. Her behavior is normal. Judgment and thought content normal.   BP 99/62 mmHg  Pulse 77  Temp(Src) 96.8 F (36 C) (Oral)  Ht 5\' 7"  (1.702 m)  Wt 145 lb (65.772 kg)  BMI 22.71 kg/m2     Assessment & Plan:  1. Adult ADHD Stress management - lisdexamfetamine (VYVANSE) 50 MG capsule; Take 1 capsule (50 mg total) by mouth every morning.  Dispense: 30 capsule; Refill: 0 - lisdexamfetamine (VYVANSE) 50 MG capsule; Take 1 capsule (50 mg total) by mouth every morning.  Dispense: 30 capsule; Refill: 0 - lisdexamfetamine (VYVANSE) 50 MG capsule; Take 1 capsule (50 mg total) by mouth every morning.  Dispense: 30 capsule; Refill: 0  2. Insomnia Bedtime ritual   Mary-Margaret  Daphine DeutscherMartin, FNP

## 2015-11-03 ENCOUNTER — Telehealth: Payer: Self-pay | Admitting: Nurse Practitioner

## 2015-11-03 NOTE — Telephone Encounter (Signed)
Sorry but cannot write controlled rx to be filled there- sorry

## 2015-11-04 NOTE — Telephone Encounter (Signed)
Left detailed message for pt 

## 2019-12-10 ENCOUNTER — Ambulatory Visit (INDEPENDENT_AMBULATORY_CARE_PROVIDER_SITE_OTHER): Payer: Managed Care, Other (non HMO) | Admitting: Internal Medicine

## 2019-12-10 ENCOUNTER — Encounter: Payer: Self-pay | Admitting: Internal Medicine

## 2019-12-10 ENCOUNTER — Other Ambulatory Visit: Payer: Self-pay

## 2019-12-10 VITALS — BP 110/72 | HR 98 | Temp 97.7°F | Wt 159.0 lb

## 2019-12-10 DIAGNOSIS — Z79899 Other long term (current) drug therapy: Secondary | ICD-10-CM

## 2019-12-10 DIAGNOSIS — F419 Anxiety disorder, unspecified: Secondary | ICD-10-CM | POA: Diagnosis not present

## 2019-12-10 DIAGNOSIS — F909 Attention-deficit hyperactivity disorder, unspecified type: Secondary | ICD-10-CM

## 2019-12-10 DIAGNOSIS — D6851 Activated protein C resistance: Secondary | ICD-10-CM

## 2019-12-10 DIAGNOSIS — Z87442 Personal history of urinary calculi: Secondary | ICD-10-CM | POA: Insufficient documentation

## 2019-12-10 MED ORDER — AMPHETAMINE-DEXTROAMPHET ER 30 MG PO CP24: 30.0000 mg | ORAL_CAPSULE | Freq: Every day | ORAL | 0 refills | Status: DC

## 2019-12-10 NOTE — Patient Instructions (Signed)
Amphetamine; Dextroamphetamine tablets What is this medicine? AMPHETAMINE; DEXTROAMPHETAMINE(am FET a meen; dex troe am FET a meen) is used to treat attention-deficit hyperactivity disorder (ADHD). It may also be used for narcolepsy. Federal law prohibits giving this medicine to any person other than the person for whom it was prescribed. Do not share this medicine with anyone else. This medicine may be used for other purposes; ask your health care provider or pharmacist if you have questions. COMMON BRAND NAME(S): Adderall What should I tell my health care provider before I take this medicine? They need to know if you have any of these conditions:  anxiety or panic attacks  circulation problems in fingers and toes  glaucoma  hardening or blockages of the arteries or heart blood vessels  heart disease or a heart defect  high blood pressure  history of a drug or alcohol abuse problem  history of stroke  kidney disease  liver disease  mental illness  seizures  suicidal thoughts, plans, or attempt; a previous suicide attempt by you or a family member  thyroid disease  Tourette's syndrome  an unusual or allergic reaction to dextroamphetamine, other amphetamines, other medicines, foods, dyes, or preservatives  pregnant or trying to get pregnant  breast-feeding How should I use this medicine? Take this medicine by mouth with a glass of water. Follow the directions on the prescription label. Take your doses at regular intervals. Do not take your medicine more often than directed. Do not suddenly stop your medicine. You must gradually reduce the dose or you may feel withdrawal effects. Ask your doctor or health care professional for advice. Talk to your pediatrician regarding the use of this medicine in children. Special care may be needed. While this drug may be prescribed for children as young as 3 years for selected conditions, precautions do apply. Overdosage: If you think  you have taken too much of this medicine contact a poison control center or emergency room at once. NOTE: This medicine is only for you. Do not share this medicine with others. What if I miss a dose? If you miss a dose, take it as soon as you can. If it is almost time for your next dose, take only that dose. Do not take double or extra doses. What may interact with this medicine? Do not take this medicine with any of the following medications:  MAOIs like Carbex, Eldepryl, Marplan, Nardil, and Parnate  other stimulant medicines for attention disorders This medicine may also interact with the following medications:  acetazolamide  ammonium chloride  antacids  ascorbic acid  atomoxetine  caffeine  certain medicines for blood pressure  certain medicines for depression, anxiety, or psychotic disturbances  certain medicines for seizures like carbamazepine, phenobarbital, phenytoin  certain medicines for stomach problems like cimetidine, ranitidine, famotidine, esomeprazole, omeprazole, lansoprazole, pantoprazole  lithium  medicines for colds and breathing difficulties  medicines for diabetes  medicines or dietary supplements for weight loss or to stay awake  methenamine  narcotic medicines for pain  quinidine  ritonavir  sodium bicarbonate  St. John's wort This list may not describe all possible interactions. Give your health care provider a list of all the medicines, herbs, non-prescription drugs, or dietary supplements you use. Also tell them if you smoke, drink alcohol, or use illegal drugs. Some items may interact with your medicine. What should I watch for while using this medicine? Visit your doctor or health care professional for regular checks on your progress. This prescription requires that you follow   special procedures with your doctor and pharmacy. You will need to have a new written prescription from your doctor every time you need a refill. This medicine  may affect your concentration, or hide signs of tiredness. Until you know how this medicine affects you, do not drive, ride a bicycle, use machinery, or do anything that needs mental alertness. Tell your doctor or health care professional if this medicine loses its effects, or if you feel you need to take more than the prescribed amount. Do not change the dosage without talking to your doctor or health care professional. Decreased appetite is a common side effect when starting this medicine. Eating small, frequent meals or snacks can help. Talk to your doctor if you continue to have poor eating habits. Height and weight growth of a child taking this medicine will be monitored closely. Do not take this medicine close to bedtime. It may prevent you from sleeping. If you are going to need surgery, a MRI, CT scan, or other procedure, tell your doctor that you are taking this medicine. You may need to stop taking this medicine before the procedure. Tell your doctor or healthcare professional right away if you notice unexplained wounds on your fingers and toes while taking this medicine. You should also tell your healthcare provider if you experience numbness or pain, changes in the skin color, or sensitivity to temperature in your fingers or toes. What side effects may I notice from receiving this medicine? Side effects that you should report to your doctor or health care professional as soon as possible:  allergic reactions like skin rash, itching or hives, swelling of the face, lips, or tongue  anxious  breathing problems  changes in emotions or moods  changes in vision  chest pain or chest tightness  fast, irregular heartbeat  fingers or toes feel numb, cool, painful  hallucination, loss of contact with reality  high blood pressure  males: prolonged or painful erection  seizures  signs and symptoms of serotonin syndrome like confusion, increased sweating, fever, tremor, stiff muscles,  diarrhea  signs and symptoms of a stroke like changes in vision; confusion; trouble speaking or understanding; severe headaches; sudden numbness or weakness of the face, arm or leg; trouble walking; dizziness; loss of balance or coordination  suicidal thoughts or other mood changes  uncontrollable head, mouth, neck, arm, or leg movements Side effects that usually do not require medical attention (report to your doctor or health care professional if they continue or are bothersome):  dry mouth  headache  irritability  loss of appetite  nausea  trouble sleeping  weight loss This list may not describe all possible side effects. Call your doctor for medical advice about side effects. You may report side effects to FDA at 1-800-FDA-1088. Where should I keep my medicine? Keep out of the reach of children. This medicine can be abused. Keep your medicine in a safe place to protect it from theft. Do not share this medicine with anyone. Selling or giving away this medicine is dangerous and against the law. Store at room temperature between 15 and 30 degrees C (59 and 86 degrees F). Keep container tightly closed. Throw away any unused medicine after the expiration date. Dispose of properly. This medicine may cause accidental overdose and death if it is taken by other adults, children, or pets. Mix any unused medicine with a substance like cat litter or coffee grounds. Then throw the medicine away in a sealed container like a sealed bag or   a coffee can with a lid. Do not use the medicine after the expiration date. NOTE: This sheet is a summary. It may not cover all possible information. If you have questions about this medicine, talk to your doctor, pharmacist, or health care provider.  2020 Elsevier/Gold Standard (2016-04-06 16:28:23)  

## 2019-12-10 NOTE — Assessment & Plan Note (Signed)
Will continue Adderall CSA and UDS today

## 2019-12-10 NOTE — Assessment & Plan Note (Signed)
Currently not an issue She is not following with hematology

## 2019-12-10 NOTE — Assessment & Plan Note (Signed)
Situational Currently not medicated Support offered

## 2019-12-10 NOTE — Assessment & Plan Note (Signed)
Will monitor

## 2019-12-10 NOTE — Progress Notes (Signed)
HPI  Pt presents to the clinic today to establish care and for management of the conditions listed below. She is transferring care from her PCP in [place].   Anxiety: Secondary to recent move. She is not taking anything for this. She is not currently seeing a therapist. She denies depression, SI/HI.  ADHD: Managed on Adderall. She does not follow with psychiatry.  Factor 5 Leiden: She has heavy menstrual periods. She does not follow with hematology.  Hx of Kidney Stones: She reports these occurs multiple times per year. She has not established with a urologist here.  Flu: never Tetanus: < 10 years ago Covid: never Pap Smear: 2017 Mammogram: 2017 Colon Screening: never  Past Medical History:  Diagnosis Date  . Anxiety   . Chronic kidney disease    kidney stones  . Complication of anesthesia    has had panic attacks after epidurals  . Rash    current "pregnancy" rash  . Shortness of breath    preg related  . UTI (lower urinary tract infection)    from kidney stones    Current Outpatient Medications  Medication Sig Dispense Refill  . lisdexamfetamine (VYVANSE) 50 MG capsule Take 1 capsule (50 mg total) by mouth every morning. 30 capsule 0  . lisdexamfetamine (VYVANSE) 50 MG capsule Take 1 capsule (50 mg total) by mouth every morning. 30 capsule 0  . lisdexamfetamine (VYVANSE) 50 MG capsule Take 1 capsule (50 mg total) by mouth every morning. 30 capsule 0   No current facility-administered medications for this visit.    Allergies  Allergen Reactions  . Demerol Itching    Family History  Problem Relation Age of Onset  . Hypertension Mother   . Arthritis Father   . Diabetes Father   . Hypertension Maternal Grandmother     Social History   Socioeconomic History  . Marital status: Married    Spouse name: Not on file  . Number of children: Not on file  . Years of education: Not on file  . Highest education level: Not on file  Occupational History  . Not on  file  Tobacco Use  . Smoking status: Never Smoker  Substance and Sexual Activity  . Alcohol use: No  . Drug use: No  . Sexual activity: Yes  Other Topics Concern  . Not on file  Social History Narrative  . Not on file   Social Determinants of Health   Financial Resource Strain:   . Difficulty of Paying Living Expenses: Not on file  Food Insecurity:   . Worried About Programme researcher, broadcasting/film/video in the Last Year: Not on file  . Ran Out of Food in the Last Year: Not on file  Transportation Needs:   . Lack of Transportation (Medical): Not on file  . Lack of Transportation (Non-Medical): Not on file  Physical Activity:   . Days of Exercise per Week: Not on file  . Minutes of Exercise per Session: Not on file  Stress:   . Feeling of Stress : Not on file  Social Connections:   . Frequency of Communication with Friends and Family: Not on file  . Frequency of Social Gatherings with Friends and Family: Not on file  . Attends Religious Services: Not on file  . Active Member of Clubs or Organizations: Not on file  . Attends Banker Meetings: Not on file  . Marital Status: Not on file  Intimate Partner Violence:   . Fear of Current or Ex-Partner: Not  on file  . Emotionally Abused: Not on file  . Physically Abused: Not on file  . Sexually Abused: Not on file    ROS:  Constitutional: Denies fever, malaise, fatigue, headache or abrupt weight changes.  HEENT: Denies eye pain, eye redness, ear pain, ringing in the ears, wax buildup, runny nose, nasal congestion, bloody nose, or sore throat. Respiratory: Denies difficulty breathing, shortness of breath, cough or sputum production.   Cardiovascular: Denies chest pain, chest tightness, palpitations or swelling in the hands or feet.  Gastrointestinal: Denies abdominal pain, bloating, constipation, diarrhea or blood in the stool.  GU: Denies frequency, urgency, pain with urination, blood in urine, odor or discharge. Musculoskeletal:  Denies decrease in range of motion, difficulty with gait, muscle pain or joint pain and swelling.  Skin: Denies redness, rashes, lesions or ulcercations.  Neurological: Denies dizziness, difficulty with memory, difficulty with speech or problems with balance and coordination.  Psych: Pt has a history of anxiety. Denies depression, SI/HI.  No other specific complaints in a complete review of systems (except as listed in HPI above).  PE:  BP 110/72   Pulse 98   Temp 97.7 F (36.5 C) (Temporal)   Wt 159 lb (72.1 kg)   LMP 12/03/2019   SpO2 98%   BMI 24.90 kg/m   Wt Readings from Last 3 Encounters:  07/19/15 145 lb (65.8 kg)  02/25/15 135 lb (61.2 kg)  08/31/14 133 lb (60.3 kg)    General: Appears her stated age, well developed, well nourished in NAD. HEENT: Head: normal shape and size; Eyes: sclera white, no icterus, conjunctiva pink, PERRLA and EOMs intact;  Neck: Neck supple, trachea midline. No masses, lumps or thyromegaly present.  Cardiovascular: Normal rate and rhythm. S1,S2 noted.   Pulmonary/Chest: Normal effort and positive vesicular breath sounds. No respiratory distress. No wheezes, rales or ronchi noted.  Musculoskeletal:  No difficulty with gait.  Neurological: Alert and oriented.  Psychiatric: Mood and affect normal. Behavior is normal. Judgment and thought content normal.   BMET    Component Value Date/Time   NA 142 02/25/2015 1459   K 4.6 02/25/2015 1459   CL 106 02/25/2015 1459   CO2 24 02/25/2015 1459   GLUCOSE 93 02/25/2015 1459   BUN 15 02/25/2015 1459   CREATININE 0.84 02/25/2015 1459   CALCIUM 9.2 02/25/2015 1459   GFRNONAA 86 02/25/2015 1459   GFRAA 99 02/25/2015 1459    Lipid Panel     Component Value Date/Time   CHOL 135 02/25/2015 1459   TRIG 44 02/25/2015 1459   HDL 64 02/25/2015 1459   CHOLHDL 2.1 02/25/2015 1459   LDLCALC 62 02/25/2015 1459    CBC    Component Value Date/Time   WBC 6.6 02/25/2015 1459   WBC 12.5 (H) 03/08/2011  0520   RBC 4.33 02/25/2015 1459   RBC 2.84 (L) 03/08/2011 0520   HGB 12.6 02/25/2015 1459   HCT 37.7 02/25/2015 1459   PLT 236 02/25/2015 1459   MCV 87 02/25/2015 1459   MCH 29.1 02/25/2015 1459   MCH 24.3 (L) 03/08/2011 0520   MCHC 33.4 02/25/2015 1459   MCHC 29.7 (L) 03/08/2011 0520   RDW 13.0 02/25/2015 1459   LYMPHSABS 1.8 02/25/2015 1459   EOSABS 0.1 02/25/2015 1459   BASOSABS 0.0 02/25/2015 1459    Hgb A1C No results found for: HGBA1C   Assessment and Plan:  Nicki Reaper, NP  This visit occurred during the SARS-CoV-2 public health emergency.  Safety protocols were  in place, including screening questions prior to the visit, additional usage of staff PPE, and extensive cleaning of exam room while observing appropriate contact time as indicated for disinfecting solutions.

## 2019-12-11 ENCOUNTER — Telehealth: Payer: Self-pay

## 2019-12-11 NOTE — Telephone Encounter (Signed)
PA has been submitted via [email or web address] and awaiting response 

## 2019-12-12 LAB — DRUG MONITORING, PANEL 8 WITH CONFIRMATION, URINE
6 Acetylmorphine: NEGATIVE ng/mL (ref <10)
Alcohol Metabolites: NEGATIVE ng/mL
Amphetamine: 6765 ng/mL — ABNORMAL HIGH (ref <250)
Amphetamines: POSITIVE ng/mL — AB (ref <500)
Benzodiazepines: NEGATIVE ng/mL (ref <100)
Buprenorphine, Urine: NEGATIVE ng/mL (ref <5)
Cocaine Metabolite: NEGATIVE ng/mL (ref <150)
Creatinine: 120.2 mg/dL
MDMA: NEGATIVE ng/mL (ref <500)
Marijuana Metabolite: NEGATIVE ng/mL (ref <20)
Methamphetamine: NEGATIVE ng/mL (ref <250)
Opiates: NEGATIVE ng/mL (ref <100)
Oxidant: NEGATIVE ug/mL
Oxycodone: NEGATIVE ng/mL (ref <100)
pH: 6.3 (ref 4.5–9.0)

## 2019-12-12 LAB — DM TEMPLATE

## 2019-12-18 NOTE — Telephone Encounter (Signed)
[id number]. AMPHET/DEXTR CAP 30MG  ER is approved through 12/10/2020

## 2020-01-06 ENCOUNTER — Other Ambulatory Visit: Payer: Self-pay | Admitting: Internal Medicine

## 2020-01-06 MED ORDER — AMPHETAMINE-DEXTROAMPHET ER 30 MG PO CP24: 30.0000 mg | ORAL_CAPSULE | Freq: Every day | ORAL | 0 refills | Status: DC

## 2020-02-08 ENCOUNTER — Other Ambulatory Visit: Payer: Self-pay | Admitting: Internal Medicine

## 2020-02-08 MED ORDER — AMPHETAMINE-DEXTROAMPHET ER 30 MG PO CP24: 30.0000 mg | ORAL_CAPSULE | Freq: Every day | ORAL | 0 refills | Status: DC

## 2020-02-08 NOTE — Telephone Encounter (Signed)
Last filled 01/06/2020...Marland Kitchen please advise

## 2020-02-24 ENCOUNTER — Encounter: Payer: Managed Care, Other (non HMO) | Admitting: Internal Medicine

## 2020-03-08 ENCOUNTER — Other Ambulatory Visit: Payer: Self-pay | Admitting: Internal Medicine

## 2020-03-08 MED ORDER — AMPHETAMINE-DEXTROAMPHET ER 30 MG PO CP24: 30.0000 mg | ORAL_CAPSULE | Freq: Every day | ORAL | 0 refills | Status: DC

## 2020-03-08 NOTE — Telephone Encounter (Signed)
Last filled 02/08/2020--- not TBF on or after 03/09/2020--- changed to DAW.Marland KitchenMarland KitchenMarland Kitchen please advise

## 2020-04-06 ENCOUNTER — Other Ambulatory Visit: Payer: Self-pay | Admitting: Internal Medicine

## 2020-04-06 NOTE — Telephone Encounter (Signed)
Refill request for Adderall XR 30 mg capsules.  LOV - 12/10/19 Next OV - not scheduled Last refill - 03/08/20 #30/0

## 2020-04-07 MED ORDER — AMPHETAMINE-DEXTROAMPHET ER 30 MG PO CP24: 30.0000 mg | ORAL_CAPSULE | Freq: Every day | ORAL | 0 refills | Status: DC

## 2020-04-30 ENCOUNTER — Other Ambulatory Visit: Payer: Self-pay | Admitting: Internal Medicine

## 2020-05-02 MED ORDER — AMPHETAMINE-DEXTROAMPHET ER 30 MG PO CP24: 30.0000 mg | ORAL_CAPSULE | Freq: Every day | ORAL | 0 refills | Status: DC

## 2020-05-02 NOTE — Telephone Encounter (Signed)
Last filled 04/09/2020... not TBF on or after 05/07/2020... please advise

## 2020-06-02 ENCOUNTER — Other Ambulatory Visit: Payer: Self-pay | Admitting: Internal Medicine

## 2020-06-02 MED ORDER — AMPHETAMINE-DEXTROAMPHET ER 30 MG PO CP24: 30.0000 mg | ORAL_CAPSULE | Freq: Every day | ORAL | 0 refills | Status: DC

## 2020-06-02 NOTE — Telephone Encounter (Signed)
Last filled 05/07/2020... note TBF on or after 06/06/2020...Marland Kitchen please advise

## 2020-07-01 ENCOUNTER — Other Ambulatory Visit: Payer: Self-pay

## 2020-07-01 MED ORDER — AMPHETAMINE-DEXTROAMPHET ER 30 MG PO CP24: 30.0000 mg | ORAL_CAPSULE | Freq: Every day | ORAL | 0 refills | Status: DC

## 2020-07-01 NOTE — Telephone Encounter (Signed)
PDMP reviewed during this encounter.  

## 2020-07-03 NOTE — Telephone Encounter (Signed)
Per EMR, this has been addressed.

## 2020-08-02 ENCOUNTER — Other Ambulatory Visit: Payer: Self-pay

## 2020-08-02 MED ORDER — AMPHETAMINE-DEXTROAMPHET ER 30 MG PO CP24: 30.0000 mg | ORAL_CAPSULE | Freq: Every day | ORAL | 0 refills | Status: DC

## 2020-08-02 NOTE — Telephone Encounter (Signed)
Name of Medication: Adderall XR Name of Pharmacy: [facility] Last [place] or Written Date and Quantity: 06/06/20, #30 Last Office Visit and Type: 10/14/210, ADHD (only visit) Next Office Visit and Type: none Last Controlled Substance Agreement Date: 12/10/19 Last UDS: 12/10/19

## 2020-08-09 NOTE — Telephone Encounter (Signed)
Patient is scheduled   

## 2020-08-15 ENCOUNTER — Telehealth (INDEPENDENT_AMBULATORY_CARE_PROVIDER_SITE_OTHER): Payer: Managed Care, Other (non HMO) | Admitting: Family Medicine

## 2020-08-15 VITALS — Ht 67.0 in | Wt 155.0 lb

## 2020-08-15 DIAGNOSIS — D6851 Activated protein C resistance: Secondary | ICD-10-CM | POA: Diagnosis not present

## 2020-08-15 DIAGNOSIS — N926 Irregular menstruation, unspecified: Secondary | ICD-10-CM | POA: Diagnosis not present

## 2020-08-15 DIAGNOSIS — F909 Attention-deficit hyperactivity disorder, unspecified type: Secondary | ICD-10-CM | POA: Diagnosis not present

## 2020-08-15 MED ORDER — AMPHETAMINE-DEXTROAMPHET ER 30 MG PO CP24: 30.0000 mg | ORAL_CAPSULE | Freq: Every day | ORAL | 0 refills | Status: DC

## 2020-08-15 NOTE — Assessment & Plan Note (Signed)
Discussed options of progesterone only pill or IUD to try and regular as likely perimenopausal. Discussed ob/gyn referral - pt would like to connect with ob/gyn - referral placed.

## 2020-08-15 NOTE — Assessment & Plan Note (Signed)
Doing well, not a candidate for estrogen ocp

## 2020-08-15 NOTE — Progress Notes (Signed)
I connected with Valerie Cole on 08/15/20 at 10:00 AM EDT by video and verified that I am speaking with the correct person using two identifiers.   I discussed the limitations, risks, security and privacy concerns of performing an evaluation and management service by video and the availability of in person appointments. I also discussed with the patient that there may be a patient responsible charge related to this service. The patient expressed understanding and agreed to proceed.  Patient location: Home Provider Location: [place] Participants: Lynnda Child and [place] Cindi Cole   Subjective:     Valerie Cole is a 50 y.o. female presenting for Medication Refill (Needs 3 months of refills until a new provider starts and pt can do TOC.Areatha Keas virtual due to covid exposure last week)     Medication Refill  # ADHD - is planning to stay with [facility] - taking 30 mg of Adderall - doing well on this medication - has been taking 9 years - started this before pregnancy and stopped - was on vyvanse but switched due to insurance - issues her entire life but diagnosed in adulthood - sleep - not a great sleeper - but most of the time sleeps well, husband works second shift so this throws off sleep  #pre-menopause - continues to have cycles but irregular - 17-30 day cycle  -    Review of Systems   Social History   Tobacco Use  Smoking Status Never  Smokeless Tobacco Never        Objective:   BP Readings from Last 3 Encounters:  12/10/19 110/72  07/19/15 99/62  02/25/15 112/73   Wt Readings from Last 3 Encounters:  08/15/20 155 lb (70.3 kg)  12/10/19 159 lb (72.1 kg)  07/19/15 145 lb (65.8 kg)    Ht 5\' 7"  (1.702 m)   Wt 155 lb (70.3 kg)   BMI 24.28 kg/m   Physical Exam Constitutional:      Appearance: Normal appearance. She is not ill-appearing.  HENT:     Head: Normocephalic and atraumatic.     Right Ear: External ear normal.     Left  Ear: External ear normal.  Eyes:     Conjunctiva/sclera: Conjunctivae normal.  Pulmonary:     Effort: Pulmonary effort is normal. No respiratory distress.  Neurological:     Mental Status: She is alert. Mental status is at baseline.  Psychiatric:        Mood and Affect: Mood normal.        Behavior: Behavior normal.        Thought Content: Thought content normal.        Judgment: Judgment normal.           Assessment & Plan:   Problem List Items Addressed This Visit       Hematopoietic and Hemostatic   Factor 5 Leiden mutation, heterozygous [facility])    Doing well, not a candidate for estrogen ocp       Relevant Orders   Ambulatory referral to Obstetrics / Gynecology     Other   Adult ADHD - Primary    Well controlled. Cont Adderall       Relevant Medications   amphetamine-dextroamphetamine (ADDERALL XR) 30 MG 24 hr capsule   amphetamine-dextroamphetamine (ADDERALL XR) 30 MG 24 hr capsule (Start on 09/14/2020)   amphetamine-dextroamphetamine (ADDERALL XR) 30 MG 24 hr capsule (Start on 10/14/2020)   Irregular periods    Discussed options of  progesterone only pill or IUD to try and regular as likely perimenopausal. Discussed ob/gyn referral - pt would like to connect with ob/gyn - referral placed.        Relevant Orders   Ambulatory referral to Obstetrics / Gynecology     Return in about 3 months (around 11/15/2020).  Lynnda Child, MD

## 2020-08-15 NOTE — Assessment & Plan Note (Signed)
Well controlled. Cont Adderall

## 2020-11-01 ENCOUNTER — Encounter: Payer: Self-pay | Admitting: Nurse Practitioner

## 2020-11-01 ENCOUNTER — Other Ambulatory Visit: Payer: Self-pay

## 2020-11-01 ENCOUNTER — Ambulatory Visit (INDEPENDENT_AMBULATORY_CARE_PROVIDER_SITE_OTHER): Payer: BC Managed Care – PPO | Admitting: Nurse Practitioner

## 2020-11-01 VITALS — BP 98/72 | HR 99 | Temp 97.6°F | Resp 14 | Ht 67.0 in | Wt 162.5 lb

## 2020-11-01 DIAGNOSIS — E559 Vitamin D deficiency, unspecified: Secondary | ICD-10-CM | POA: Diagnosis not present

## 2020-11-01 DIAGNOSIS — Z0000 Encounter for general adult medical examination without abnormal findings: Secondary | ICD-10-CM | POA: Insufficient documentation

## 2020-11-01 DIAGNOSIS — F909 Attention-deficit hyperactivity disorder, unspecified type: Secondary | ICD-10-CM

## 2020-11-01 DIAGNOSIS — Z1211 Encounter for screening for malignant neoplasm of colon: Secondary | ICD-10-CM

## 2020-11-01 DIAGNOSIS — N926 Irregular menstruation, unspecified: Secondary | ICD-10-CM

## 2020-11-01 LAB — COMPREHENSIVE METABOLIC PANEL
ALT: 12 U/L (ref 0–35)
AST: 15 U/L (ref 0–37)
Albumin: 4.1 g/dL (ref 3.5–5.2)
Alkaline Phosphatase: 64 U/L (ref 39–117)
BUN: 15 mg/dL (ref 6–23)
CO2: 28 mEq/L (ref 19–32)
Calcium: 9.2 mg/dL (ref 8.4–10.5)
Chloride: 104 mEq/L (ref 96–112)
Creatinine, Ser: 0.82 mg/dL (ref 0.40–1.20)
GFR: 84.82 mL/min (ref >60.00)
Glucose, Bld: 90 mg/dL (ref 70–99)
Potassium: 4.5 mEq/L (ref 3.5–5.1)
Sodium: 138 mEq/L (ref 135–145)
Total Bilirubin: 0.6 mg/dL (ref 0.2–1.2)
Total Protein: 6.9 g/dL (ref 6.0–8.3)

## 2020-11-01 LAB — TSH: TSH: 1.77 u[IU]/mL (ref 0.35–5.50)

## 2020-11-01 LAB — CBC
HCT: 43.1 % (ref 36.0–46.0)
Hemoglobin: 14.2 g/dL (ref 12.0–15.0)
MCHC: 33 g/dL (ref 30.0–36.0)
MCV: 87.6 fl (ref 78.0–100.0)
Platelets: 261 10*3/uL (ref 150.0–400.0)
RBC: 4.92 Mil/uL (ref 3.87–5.11)
RDW: 13.8 % (ref 11.5–15.5)
WBC: 7.8 10*3/uL (ref 4.0–10.5)

## 2020-11-01 LAB — LIPID PANEL
Cholesterol: 167 mg/dL (ref 0–200)
HDL: 59.2 mg/dL (ref >39.00)
LDL Cholesterol: 90 mg/dL (ref 0–99)
NonHDL: 108.28
Total CHOL/HDL Ratio: 3
Triglycerides: 93 mg/dL (ref 0.0–149.0)
VLDL: 18.6 mg/dL (ref 0.0–40.0)

## 2020-11-01 LAB — VITAMIN D 25 HYDROXY (VIT D DEFICIENCY, FRACTURES): VITD: 25.8 ng/mL — ABNORMAL LOW (ref 30.00–100.00)

## 2020-11-01 LAB — HEMOGLOBIN A1C: Hgb A1c MFr Bld: 5.4 % (ref 4.6–6.5)

## 2020-11-01 NOTE — Assessment & Plan Note (Signed)
Discussed recommended/age-appropriate preventative exams.  Will place referral for colonoscopy.  Refer to GYN for Pap and mammogram per patient's request.  Pending lab results.

## 2020-11-01 NOTE — Assessment & Plan Note (Signed)
History of same.  Has been referred to GYN in the past but lost insurance coverage and patient was unable to go.  Request referral.  Order placed today for Pap, mammogram, irregular periods with a history of factor V Leiden.

## 2020-11-01 NOTE — Patient Instructions (Signed)
Nice to see you today Will be in touch regarding your labs See you in 6 months

## 2020-11-01 NOTE — Progress Notes (Signed)
Established Patient Office Visit  Subjective:  Patient ID: Valerie Cole, female    DOB: March 31, 1970  Age: 50 y.o. MRN: [id number]  CC:  Chief Complaint  Patient presents with   Transfer of Care    Referral to gyn    HPI Valerie Cole presents for complete physical and follow up of chronic conditions.  Immunizations: -Tetanus: unsure thinks within 10 years. Has a 60 year old daughter -Influenza: Refused -Covid-19: Refused -Shingles: NA -Pneumonia: NA  -HPV: N/A  Diet: Fair diet. Healthy eating. Just have a sweet tooth. Does not eat fried foods. States she eats chicken and on occasion red meat (like steak). Has cut dairy Exercise: Does zumba and walk. 30-45 work out in the mornings and a 30 minute walk  Eye exam: Has missed a year. Due Dental exam:  needs to establish  Pap Smear: Completed in approx 5 years Mammogram: Completed in approx 5 years ago. Mom dx at age 21 with lumpectomy Dexa: Completed in Colonoscopy: Completed in    Lung Cancer Screening: Never smoker.   ADHD: currently maintained on adderrall xr. Does well. 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 States she has tried to no be on medication but notices a great difference.   Perimenpause: States earlier 48s. Has been having irregular periods. Patient states sometimes every 17 days. Was referred to gyn but lost insurance coverage. Now has coverage and would like to be referred and also have her pap smear and mammogram managed by the GYN group.  Past Medical History:  Diagnosis Date   Anxiety    Factor 5 Leiden mutation, heterozygous [facility])     Past Surgical History:  Procedure Laterality Date   CESAREAN SECTION     colapsed lung     kidney stint     kidney stent   LITHOTRIPSY      Family History  Problem Relation Age of Onset   Hypertension Mother    Breast cancer Mother    Arthritis Mother    Heart disease Mother    Hyperlipidemia Mother    Diabetes Mother     Arthritis Father    Diabetes Father    Hypertension Maternal Grandmother    Arthritis Maternal Grandmother    Hyperlipidemia Maternal Grandmother    Heart disease Maternal Grandmother    Heart disease Paternal Grandfather     Social History   Socioeconomic History   Marital status: Married    Spouse name: Not on file   Number of children: Not on file   Years of education: Not on file   Highest education level: Not on file  Occupational History   Not on file  Tobacco Use   Smoking status: Never   Smokeless tobacco: Never  Substance and Sexual Activity   Alcohol use: No   Drug use: No   Sexual activity: Yes  Other Topics Concern   Not on file  Social History Narrative   Not on file   Social Determinants of Health   Financial Resource Strain: Not on file  Food Insecurity: Not on file  Transportation Needs: Not on file  Physical Activity: Not on file  Stress: Not on file  Social Connections: Not on file  Intimate Partner Violence: Not on file    Outpatient Medications Prior to Visit  Medication Sig Dispense Refill   amphetamine-dextroamphetamine (ADDERALL XR) 30 MG 24 hr capsule Take 1 capsule (30 mg total) by mouth daily. 30 capsule 0  amphetamine-dextroamphetamine (ADDERALL XR) 30 MG 24 hr capsule Take 1 capsule (30 mg total) by mouth daily. 30 capsule 0   amphetamine-dextroamphetamine (ADDERALL XR) 30 MG 24 hr capsule Take 1 capsule (30 mg total) by mouth daily. 30 capsule 0   cetirizine (ZYRTEC) 10 MG tablet Take 10 mg by mouth daily.     Cholecalciferol (VITAMIN D) 50 MCG (2000 UT) tablet Take 2,000 Units by mouth daily.     Zinc Sulfate (ZINC 15 PO) Take by mouth.     No facility-administered medications prior to visit.    Allergies  Allergen Reactions   Demerol Itching    ROS Review of Systems  Constitutional:  Negative for chills, fatigue and fever.  Respiratory:  Negative for shortness of breath.   Cardiovascular:  Negative for chest pain,  palpitations and leg swelling.  Gastrointestinal:  Negative for abdominal pain, blood in stool, constipation, diarrhea, nausea and vomiting.  Genitourinary:  Positive for menstrual problem. Negative for dysuria, flank pain, vaginal discharge and vaginal pain.  Neurological:  Negative for dizziness, light-headedness and headaches.  Psychiatric/Behavioral:  Negative for hallucinations and suicidal ideas.      Objective:    Physical Exam Vitals and nursing note reviewed.  Constitutional:      Appearance: Normal appearance.  HENT:     Right Ear: Tympanic membrane, ear canal and external ear normal. There is no impacted cerumen.     Left Ear: Tympanic membrane, ear canal and external ear normal. There is no impacted cerumen.  Eyes:     Extraocular Movements: Extraocular movements intact.     Pupils: Pupils are equal, round, and reactive to light.  Cardiovascular:     Rate and Rhythm: Normal rate and regular rhythm.     Pulses:          Radial pulses are 2+ on the right side and 2+ on the left side.       Posterior tibial pulses are 2+ on the right side and 2+ on the left side.     Heart sounds: No murmur heard. Pulmonary:     Effort: Pulmonary effort is normal.     Breath sounds: Normal breath sounds.  Abdominal:     General: Bowel sounds are normal. There is no distension.     Palpations: There is no mass.     Tenderness: There is no abdominal tenderness.  Musculoskeletal:     Right lower leg: No edema.     Left lower leg: No edema.  Lymphadenopathy:     Cervical: No cervical adenopathy.  Skin:    General: Skin is warm.  Neurological:     General: No focal deficit present.     Mental Status: She is alert.     Motor: No weakness.     Coordination: Coordination normal.     Gait: Gait normal.     Deep Tendon Reflexes: Reflexes normal.     Reflex Scores:      Bicep reflexes are 2+ on the right side and 2+ on the left side.      Patellar reflexes are 2+ on the right side and 2+  on the left side. Psychiatric:        Mood and Affect: Mood normal.        Behavior: Behavior normal.        Thought Content: Thought content normal.        Judgment: Judgment normal.    BP 98/72   Pulse 99   Temp 97.6  F (36.4 C)   Resp 14   Ht 5\' 7"  (1.702 m)   Wt 162 lb 8 oz (73.7 kg)   SpO2 100%   BMI 25.45 kg/m  Wt Readings from Last 3 Encounters:  11/01/20 162 lb 8 oz (73.7 kg)  08/15/20 155 lb (70.3 kg)  12/10/19 159 lb (72.1 kg)     Health Maintenance Due  Topic Date Due   Pneumococcal Vaccine 33-36 Years old (1 - PCV) Never done   MAMMOGRAM  03/05/2014   PAP SMEAR-Modifier  12/01/2014   COLONOSCOPY (Pts 45-19yrs Insurance coverage will need to be confirmed)  Never done    There are no preventive care reminders to display for this patient.  Lab Results  Component Value Date   TSH 0.584 02/25/2015   Lab Results  Component Value Date   WBC 6.6 02/25/2015   HGB 12.6 02/25/2015   HCT 37.7 02/25/2015   MCV 87 02/25/2015   PLT 236 02/25/2015   Lab Results  Component Value Date   NA 142 02/25/2015   K 4.6 02/25/2015   CO2 24 02/25/2015   GLUCOSE 93 02/25/2015   BUN 15 02/25/2015   CREATININE 0.84 02/25/2015   BILITOT 0.6 02/25/2015   ALKPHOS 55 02/25/2015   AST 14 02/25/2015   ALT 9 02/25/2015   PROT 6.7 02/25/2015   ALBUMIN 4.3 02/25/2015   CALCIUM 9.2 02/25/2015   Lab Results  Component Value Date   CHOL 135 02/25/2015   Lab Results  Component Value Date   HDL 64 02/25/2015   Lab Results  Component Value Date   LDLCALC 62 02/25/2015   Lab Results  Component Value Date   TRIG 44 02/25/2015   Lab Results  Component Value Date   CHOLHDL 2.1 02/25/2015   No results found for: HGBA1C    Assessment & Plan:   Problem List Items Addressed This Visit       Other   Adult ADHD    Currently maintained on Adderall 30 mg XR.  Patient tolerating medication well.  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  She is tried weaning herself off to see if she could function appropriate without it was unable to.  Last UDS and controlled substance contract within date.  Continue Adderall 30 mg XR.      Vitamin D deficiency    History of the same.  Patient currently maintained on 2000 IUs of over-the-counter vitamin D.  Check lab in office.  Pending lab results.      Relevant Orders   VITAMIN D 25 Hydroxy (Vit-D Deficiency, Fractures)   Irregular periods    History of same.  Has been referred to GYN in the past but lost insurance coverage and patient was unable to go.  Request referral.  Order placed today for Pap, mammogram, irregular periods with a history of factor V Leiden.      Relevant Orders   Ambulatory referral to Gynecology   Preventative health care - Primary    Discussed recommended/age-appropriate preventative exams.  Will place referral for colonoscopy.  Refer to GYN for Pap and mammogram per patient's request.  Pending lab results.      Relevant Orders   CBC   Comprehensive metabolic panel   Hemoglobin A1c   TSH   Lipid panel   Other Visit Diagnoses     Screening for colon cancer       Relevant Orders   Ambulatory referral to Gastroenterology  No orders of the defined types were placed in this encounter.   Follow-up: Return in about 6 months (around 05/01/2021) for follow up.   This visit occurred during the SARS-CoV-2 public health emergency.  Safety protocols were in place, including screening questions prior to the visit, additional usage of staff PPE, and extensive cleaning of exam room while observing appropriate contact time as indicated for disinfecting solutions.   Audria NineMatt Yavier Snider, NP

## 2020-11-01 NOTE — Assessment & Plan Note (Signed)
Currently maintained on Adderall 30 mg XR.  Patient tolerating medication well.  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  She is tried weaning herself off to see if she could function appropriate without it was unable to.  Last UDS and controlled substance contract within date.  Continue Adderall 30 mg XR.

## 2020-11-01 NOTE — Assessment & Plan Note (Signed)
History of the same.  Patient currently maintained on 2000 IUs of over-the-counter vitamin D.  Check lab in office.  Pending lab results.

## 2020-11-07 ENCOUNTER — Encounter: Payer: Self-pay | Admitting: Obstetrics and Gynecology

## 2020-11-28 ENCOUNTER — Other Ambulatory Visit: Payer: Self-pay | Admitting: Family Medicine

## 2020-11-28 ENCOUNTER — Other Ambulatory Visit: Payer: Self-pay | Admitting: Nurse Practitioner

## 2020-11-28 DIAGNOSIS — F909 Attention-deficit hyperactivity disorder, unspecified type: Secondary | ICD-10-CM

## 2020-11-28 MED ORDER — AMPHETAMINE-DEXTROAMPHET ER 30 MG PO CP24: 30.0000 mg | ORAL_CAPSULE | Freq: Every day | ORAL | 0 refills | Status: DC

## 2020-11-28 NOTE — Telephone Encounter (Signed)
Last refilled 08/15/20 x 2 RXs. LOV 11/01/20 with Audria Nine for [facility] and CPE Next appointment on 05/01/2021 UDS: 12/10/19 Contract letter: 12/10/19

## 2020-12-12 ENCOUNTER — Encounter: Payer: Managed Care, Other (non HMO) | Admitting: Internal Medicine

## 2020-12-28 ENCOUNTER — Other Ambulatory Visit: Payer: Self-pay | Admitting: Nurse Practitioner

## 2020-12-28 DIAGNOSIS — F909 Attention-deficit hyperactivity disorder, unspecified type: Secondary | ICD-10-CM

## 2021-01-25 ENCOUNTER — Encounter: Payer: Self-pay | Admitting: Certified Nurse Midwife

## 2021-01-25 ENCOUNTER — Other Ambulatory Visit (HOSPITAL_COMMUNITY)
Admission: RE | Admit: 2021-01-25 | Discharge: 2021-01-25 | Disposition: A | Discharge status: Home / Self Care | Payer: BC Managed Care – PPO | Source: Ambulatory Visit | Attending: Certified Nurse Midwife | Admitting: Certified Nurse Midwife

## 2021-01-25 ENCOUNTER — Ambulatory Visit: Payer: BC Managed Care – PPO | Admitting: Certified Nurse Midwife

## 2021-01-25 ENCOUNTER — Other Ambulatory Visit: Payer: Self-pay

## 2021-01-25 VITALS — BP 122/80 | HR 117 | Ht 67.0 in | Wt 165.2 lb

## 2021-01-25 DIAGNOSIS — Z1231 Encounter for screening mammogram for malignant neoplasm of breast: Secondary | ICD-10-CM | POA: Diagnosis not present

## 2021-01-25 DIAGNOSIS — Z01419 Encounter for gynecological examination (general) (routine) without abnormal findings: Secondary | ICD-10-CM | POA: Insufficient documentation

## 2021-01-25 DIAGNOSIS — Z124 Encounter for screening for malignant neoplasm of cervix: Secondary | ICD-10-CM

## 2021-01-25 NOTE — Progress Notes (Signed)
GYNECOLOGY ANNUAL PREVENTATIVE CARE ENCOUNTER NOTE  History:     Valerie Cole is a 50 y.o. [phone] female here for a routine annual gynecologic exam.  Current complaints: irregular cycle 15 to 28 days . Has 2 periods every other month..   Denies abnormal vaginal bleeding, discharge, pelvic pain, problems with intercourse or other gynecologic concerns.     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 Contraception: tubal ligation Last Pap: several yrs ago per pt . Results were: normal with negative HPV Last mammogram: several yrs ago . Results were: normal  Obstetric History OB History  Gravida Para Term Preterm AB Living  3 3 2 1   3   SAB IAB Ectopic Multiple Live Births          1    # Outcome Date GA Lbr Len/2nd Weight Sex Delivery Anes PTL Lv  3 Term 03/06/11 [redacted]w[redacted]d  6 lb 8.8 oz (2.97 kg) F CS-LTranv Spinal  LIV     Birth Comments: no dysmorphic features, Voided in OR  2 Preterm           1 Term             Past Medical History:  Diagnosis Date   Anxiety    Factor 5 Leiden mutation, heterozygous (HCC)     Past Surgical History:  Procedure Laterality Date   CESAREAN SECTION     colapsed lung     kidney stint     kidney stent   LITHOTRIPSY      Current Outpatient Medications on File Prior to Visit  Medication Sig Dispense Refill   amphetamine-dextroamphetamine (ADDERALL XR) 30 MG 24 hr capsule Take 1 capsule (30 mg total) by mouth daily. 30 capsule 0   cetirizine (ZYRTEC) 10 MG tablet Take 10 mg by mouth daily.     Cholecalciferol (VITAMIN D) 50 MCG (2000 UT) tablet Take 2,000 Units by mouth daily.     Zinc Sulfate (ZINC 15 PO) Take by mouth.      amphetamine-dextroamphetamine (ADDERALL XR) 30 MG 24 hr capsule Take 1 capsule (30 mg total) by mouth daily. (Patient not taking: Reported on 01/25/2021) 30 capsule 0   amphetamine-dextroamphetamine (ADDERALL XR) 30 MG 24 hr capsule Take 1 capsule (30 mg total) by mouth daily. (Patient not taking: Reported on 01/25/2021) 30 capsule 0   No current facility-administered medications on file prior to visit.    Allergies  Allergen Reactions   Demerol Itching    Social History:  reports that she has never smoked. She has never used smokeless tobacco. She reports that she does not drink alcohol and does not use drugs.  Family History  Problem Relation Age of Onset   Hypertension Mother    Breast cancer Mother    Arthritis Mother    Heart disease Mother    Hyperlipidemia Mother    Diabetes Mother    Arthritis Father    Diabetes Father    Hypertension Maternal Grandmother    Arthritis Maternal Grandmother    Hyperlipidemia Maternal Grandmother    Heart disease Maternal Grandmother    Heart disease Paternal Grandfather     The following portions of the patient's history  were reviewed and updated as appropriate: allergies, current medications, past family history, past medical history, past social history, past surgical history and problem list.  Review of Systems Pertinent items noted in HPI and remainder of comprehensive ROS otherwise negative.  Physical Exam:  BP 122/80   Pulse (!) 117   Ht 5\' 7"  (1.702 m)   Wt 165 lb 3.2 oz (74.9 kg)   LMP 01/22/2021 (Exact Date)   BMI 25.87 kg/m  CONSTITUTIONAL: Well-developed, well-nourished female in no acute distress.  HENT:  Normocephalic, atraumatic, External right and left ear normal. Oropharynx is clear and moist EYES: Conjunctivae and EOM are normal. Pupils are equal, round, and reactive to light. No scleral icterus.  NECK: Normal range of motion, supple, no masses.  Normal thyroid.  SKIN: Skin is warm and dry. No rash noted. Not  diaphoretic. No erythema. No pallor. MUSCULOSKELETAL: Normal range of motion. No tenderness.  No cyanosis, clubbing, or edema.  2+ distal pulses. NEUROLOGIC: Alert and oriented to person, place, and time. Normal reflexes, muscle tone coordination.  PSYCHIATRIC: Normal mood and affect. Normal behavior. Normal judgment and thought content. CARDIOVASCULAR: Normal heart rate noted, regular rhythm RESPIRATORY: Clear to auscultation bilaterally. Effort and breath sounds normal, no problems with respiration noted. BREASTS: Symmetric in size. No masses, tenderness, skin changes, nipple drainage, or lymphadenopathy bilaterally.  ABDOMEN: Soft, no distention noted.  No tenderness, rebound or guarding.  PELVIC: Normal appearing external genitalia and urethral meatus; normal appearing vaginal mucosa and cervix.  No abnormal discharge noted.  Pap smear obtained.  Normal uterine size, no other palpable masses, no uterine or adnexal tenderness.  .   Assessment and Plan:  Annual Well Women GYN exam   Pap: will follow up with results  Mammogram : ordered Labs: none  Refills: none Referral: none Discussed use of progestin only method to control cycles .  Routine preventative health maintenance measures emphasized. Please refer to After Visit Summary for other counseling recommendations.      01/24/2021, CNM [facility],  [facility]

## 2021-01-25 NOTE — Patient Instructions (Signed)
Preventive Care 92-48 Years Old, Female Preventive care refers to lifestyle choices and visits with your health care provider that can promote health and wellness. Preventive care visits are also called wellness exams. What can I expect for my preventive care visit? Counseling Your health care provider may ask you questions about your: Medical history, including: Past medical problems. Family medical history. Pregnancy history. Current health, including: Menstrual cycle. Method of birth control. Emotional well-being. Home life and relationship well-being. Sexual activity and sexual health. Lifestyle, including: Alcohol, nicotine or tobacco, and drug use. Access to firearms. Diet, exercise, and sleep habits. Work and work Statistician. Sunscreen use. Safety issues such as seatbelt and bike helmet use. Physical exam Your health care provider will check your: Height and weight. These may be used to calculate your BMI (body mass index). BMI is a measurement that tells if you are at a healthy weight. Waist circumference. This measures the distance around your waistline. This measurement also tells if you are at a healthy weight and may help predict your risk of certain diseases, such as type 2 diabetes and high blood pressure. Heart rate and blood pressure. Body temperature. Skin for abnormal spots. What immunizations do I need? Vaccines are usually given at various ages, according to a schedule. Your health care provider will recommend vaccines for you based on your age, medical history, and lifestyle or other factors, such as travel or where you work. What tests do I need? Screening Your health care provider may recommend screening tests for certain conditions. This may include: Lipid and cholesterol levels. Diabetes screening. This is done by checking your blood sugar (glucose) after you have not eaten for a while (fasting). Pelvic exam and Pap test. Hepatitis B test. Hepatitis C  test. HIV (human immunodeficiency virus) test. STI (sexually transmitted infection) testing, if you are at risk. Lung cancer screening. Colorectal cancer screening. Mammogram. Talk with your health care provider about when you should start having regular mammograms. This may depend on whether you have a family history of breast cancer. BRCA-related cancer screening. This may be done if you have a family history of breast, ovarian, tubal, or peritoneal cancers. Bone density scan. This is done to screen for osteoporosis. Talk with your health care provider about your test results, treatment options, and if necessary, the need for more tests. Follow these instructions at home: Eating and drinking  Eat a diet that includes fresh fruits and vegetables, whole grains, lean protein, and low-fat dairy products. Take vitamin and mineral supplements as recommended by your health care provider. Do not drink alcohol if: Your health care provider tells you not to drink. You are pregnant, may be pregnant, or are planning to become pregnant. If you drink alcohol: Limit how much you have to 0-1 drink a day. Know how much alcohol is in your drink. In the [place] one drink equals one 12 oz bottle of beer (355 mL), one 5 oz glass of wine (148 mL), or one 1 oz glass of hard liquor (44 mL). Lifestyle Brush your teeth every morning and night with fluoride toothpaste. Floss one time each day. Exercise for at least 30 minutes 5 or more days each week. Do not use any products that contain nicotine or tobacco. These products include cigarettes, chewing tobacco, and vaping devices, such as e-cigarettes. If you need help quitting, ask your health care provider. Do not use drugs. If you are sexually active, practice safe sex. Use a condom or other form of protection to prevent  STIs. If you do not wish to become pregnant, use a form of birth control. If you plan to become pregnant, see your health care provider for a  prepregnancy visit. Take aspirin only as told by your health care provider. Make sure that you understand how much to take and what form to take. Work with your health care provider to find out whether it is safe and beneficial for you to take aspirin daily. Find healthy ways to manage stress, such as: Meditation, yoga, or listening to music. Journaling. Talking to a trusted person. Spending time with friends and family. Minimize exposure to UV radiation to reduce your risk of skin cancer. Safety Always wear your seat belt while driving or riding in a vehicle. Do not drive: If you have been drinking alcohol. Do not ride with someone who has been drinking. When you are tired or distracted. While texting. If you have been using any mind-altering substances or drugs. Wear a helmet and other protective equipment during sports activities. If you have firearms in your house, make sure you follow all gun safety procedures. Seek help if you have been physically or sexually abused. What's next? Visit your health care provider once a year for an annual wellness visit. Ask your health care provider how often you should have your eyes and teeth checked. Stay up to date on all vaccines. This information is not intended to replace advice given to you by your health care provider. Make sure you discuss any questions you have with your health care provider. Document Revised: 08/10/2020 Document Reviewed: 08/10/2020 Elsevier Patient Education  [employer].

## 2021-02-02 LAB — CYTOLOGY - PAP
Comment: NEGATIVE
Diagnosis: REACTIVE
High risk HPV: NEGATIVE

## 2021-02-24 ENCOUNTER — Other Ambulatory Visit: Payer: Self-pay | Admitting: Nurse Practitioner

## 2021-02-24 DIAGNOSIS — F909 Attention-deficit hyperactivity disorder, unspecified type: Secondary | ICD-10-CM

## 2021-02-24 MED ORDER — AMPHETAMINE-DEXTROAMPHET ER 30 MG PO CP24: 30.0000 mg | ORAL_CAPSULE | Freq: Every day | ORAL | 0 refills | Status: DC

## 2021-02-24 NOTE — Telephone Encounter (Signed)
Last written 11-28-20 #30 x2 Last OV 11-01-20 Next OV 05-01-21 [facility] [address] [address]

## 2021-03-30 ENCOUNTER — Other Ambulatory Visit: Payer: Self-pay | Admitting: Nurse Practitioner

## 2021-03-30 ENCOUNTER — Telehealth: Payer: Self-pay | Admitting: Nurse Practitioner

## 2021-03-30 DIAGNOSIS — F909 Attention-deficit hyperactivity disorder, unspecified type: Secondary | ICD-10-CM

## 2021-03-30 NOTE — Telephone Encounter (Signed)
Sent a message attached to refill.  We can try vyvanse. Need to see if she is ok with that or if she has ever tried it in the past. I cannot tell her how much the medication is going to be through her insurance though until we send it to her pharmacy

## 2021-03-30 NOTE — Telephone Encounter (Signed)
Pt called stating that she called Pharmacies about medication amphetamine-dextroamphetamine (ADDERALL XR) 30 MG 24 hr capsule, and all Pharmacies state that they are all backed up and want have it until about a month. Pt is asking is there any other  medication she can substitute. Please advise.

## 2021-03-30 NOTE — Telephone Encounter (Signed)
See refill request note

## 2021-03-30 NOTE — Telephone Encounter (Signed)
Patient advised. Patient did try Vyvanse in the past and it was not as affective as Adderall but if there is nothing else that would be generic that is close to working like Adderall or any other suggestions then she is willing to try it and see if the insurance will cover it. Advised patient I will see what Susy Frizzle suggests

## 2021-03-31 ENCOUNTER — Other Ambulatory Visit: Payer: Self-pay | Admitting: Nurse Practitioner

## 2021-03-31 DIAGNOSIS — F909 Attention-deficit hyperactivity disorder, unspecified type: Secondary | ICD-10-CM

## 2021-03-31 MED ORDER — DEXMETHYLPHENIDATE HCL ER 20 MG PO CP24: 20.0000 mg | ORAL_CAPSULE | Freq: Every day | ORAL | 0 refills | Status: DC

## 2021-03-31 NOTE — Progress Notes (Signed)
Cancelled the Adderall script for the month of Feb. Will switch to focalin xr

## 2021-03-31 NOTE — Telephone Encounter (Signed)
Patient advised. Please decline Adderall refill request in order to close the note

## 2021-04-10 ENCOUNTER — Encounter: Payer: Self-pay | Admitting: Nurse Practitioner

## 2021-05-01 ENCOUNTER — Ambulatory Visit: Payer: Self-pay | Admitting: Nurse Practitioner

## 2021-05-02 ENCOUNTER — Ambulatory Visit: Payer: BC Managed Care – PPO | Admitting: Nurse Practitioner

## 2021-05-02 ENCOUNTER — Encounter: Payer: Self-pay | Admitting: Nurse Practitioner

## 2021-05-02 ENCOUNTER — Other Ambulatory Visit: Payer: Self-pay

## 2021-05-02 VITALS — BP 104/82 | HR 85 | Temp 96.9°F | Resp 14 | Ht 67.0 in | Wt 159.3 lb

## 2021-05-02 DIAGNOSIS — Z79899 Other long term (current) drug therapy: Secondary | ICD-10-CM

## 2021-05-02 DIAGNOSIS — F909 Attention-deficit hyperactivity disorder, unspecified type: Secondary | ICD-10-CM | POA: Diagnosis not present

## 2021-05-02 MED ORDER — AMPHETAMINE-DEXTROAMPHET ER 30 MG PO CP24: 30.0000 mg | ORAL_CAPSULE | Freq: Every day | ORAL | 0 refills | Status: DC

## 2021-05-02 NOTE — Assessment & Plan Note (Signed)
Patient doing well. She has been maintained on Adderrall 30mg  XR. No reports of insomnia due to medication. She has lost some weight but mentioned that it was intentional in nature.  ?

## 2021-05-02 NOTE — Progress Notes (Signed)
Established Patient Office Visit  Subjective:  Patient ID: Valerie Cole, female    DOB: 06-Feb-1971  Age: 51 y.o. MRN: [id number]  CC:  Chief Complaint  Patient presents with   ADHD    Follow up, did not tolerate Focalin RX, she is back on Adderall she was able to get this RX    HPI Valerie Cole presents for ADHD  Patient is here for a 6 month follow up her ADHD. Saw her last time and is maintained on adderall. Did run into a supply change issue and we tried her on Focalin and she did not tolerate it well.   States that she had a tooth extraction approx 2 weeks ago. Has been seen twice since them and was placed on clindamycin. States that she has a dry socket. This is being managed by her dentist. Last visit this week.   Regular menstrual period now. LMP: 2 weeks ago. States very light. She was evaluated by GYN for her PAP and futher evaluation. They states that she was not a candidate for IUD. She has started taking over the counter Hormone Harmony and has felt good since starting      Past Medical History:  Diagnosis Date   Anxiety    Factor 5 Leiden mutation, heterozygous (HCC)     Past Surgical History:  Procedure Laterality Date   CESAREAN SECTION     colapsed lung     kidney stint     kidney stent   LITHOTRIPSY      Family History  Problem Relation Age of Onset   Hypertension Mother    Breast cancer Mother    Arthritis Mother    Heart disease Mother    Hyperlipidemia Mother    Diabetes Mother    Arthritis Father    Diabetes Father    Hypertension Maternal Grandmother    Arthritis Maternal Grandmother    Hyperlipidemia Maternal Grandmother    Heart disease Maternal Grandmother    Heart disease Paternal Grandfather     Social History   Socioeconomic History   Marital status: Married    Spouse name: Not on file   Number of children: Not on file   Years of education: Not on file   Highest education level: Not on file  Occupational History   Not on  file  Tobacco Use   Smoking status: Never   Smokeless tobacco: Never  Substance and Sexual Activity   Alcohol use: No   Drug use: No   Sexual activity: Yes  Other Topics Concern   Not on file  Social History Narrative   Not on file   Social Determinants of Health   Financial Resource Strain: Not on file  Food Insecurity: Not on file  Transportation Needs: Not on file  Physical Activity: Not on file  Stress: Not on file  Social Connections: Not on file  Intimate Partner Violence: Not on file    Outpatient Medications Prior to Visit  Medication Sig Dispense Refill   cetirizine (ZYRTEC) 10 MG tablet Take 10 mg by mouth daily.     Cholecalciferol (VITAMIN D) 50 MCG (2000 UT) tablet Take 2,000 Units by mouth daily.     clindamycin (CLEOCIN) 300 MG capsule Take 300 mg by mouth 3 (three) times daily.     OVER THE COUNTER MEDICATION HORMONE HARMONY     Zinc Sulfate (ZINC 15 PO) Take by mouth.     amphetamine-dextroamphetamine (ADDERALL XR) 30 MG 24 hr capsule Take  1 capsule (30 mg total) by mouth daily. 30 capsule 0   amphetamine-dextroamphetamine (ADDERALL XR) 30 MG 24 hr capsule Take 1 capsule (30 mg total) by mouth daily. 30 capsule 0   amphetamine-dextroamphetamine (ADDERALL XR) 30 MG 24 hr capsule Take 1 capsule (30 mg total) by mouth daily. 30 capsule 0   dexmethylphenidate (FOCALIN XR) 20 MG 24 hr capsule Take 1 capsule (20 mg total) by mouth daily. 30 capsule 0   No facility-administered medications prior to visit.    Allergies  Allergen Reactions   Focalin Xr [Dexmethylphenidate Hcl Er] Nausea Only    And broke out in sweat when taking on empty stomach.   Demerol Itching    ROS Review of Systems  Constitutional:  Negative for chills and fever.  Cardiovascular:  Negative for chest pain and palpitations.  Gastrointestinal:  Negative for diarrhea, nausea and vomiting.  Psychiatric/Behavioral:  Negative for hallucinations, sleep disturbance and suicidal ideas.       Objective:    Physical Exam Vitals and nursing note reviewed.  Constitutional:      Appearance: Normal appearance.  HENT:     Mouth/Throat:   Cardiovascular:     Rate and Rhythm: Normal rate and regular rhythm.     Heart sounds: Normal heart sounds.  Pulmonary:     Effort: Pulmonary effort is normal.     Breath sounds: Normal breath sounds.  Abdominal:     General: Bowel sounds are normal.  Lymphadenopathy:     Cervical: Cervical adenopathy (anterior) present.  Neurological:     Mental Status: She is alert.  Psychiatric:        Mood and Affect: Mood normal.        Behavior: Behavior normal.        Thought Content: Thought content normal.        Judgment: Judgment normal.    BP 104/82    Pulse 85    Temp (!) 96.9 F (36.1 C)    Resp 14    Ht 5\' 7"  (1.702 m)    Wt 159 lb 5 oz (72.3 kg)    SpO2 97%    BMI 24.95 kg/m  Wt Readings from Last 3 Encounters:  05/02/21 159 lb 5 oz (72.3 kg)  01/25/21 165 lb 3.2 oz (74.9 kg)  11/01/20 162 lb 8 oz (73.7 kg)     Health Maintenance Due  Topic Date Due   MAMMOGRAM  03/05/2014   COLONOSCOPY (Pts 45-22yrs Insurance coverage will need to be confirmed)  Never done    There are no preventive care reminders to display for this patient.  Lab Results  Component Value Date   TSH 1.77 11/01/2020   Lab Results  Component Value Date   WBC 7.8 11/01/2020   HGB 14.2 11/01/2020   HCT 43.1 11/01/2020   MCV 87.6 11/01/2020   PLT 261.0 11/01/2020   Lab Results  Component Value Date   NA 138 11/01/2020   K 4.5 11/01/2020   CO2 28 11/01/2020   GLUCOSE 90 11/01/2020   BUN 15 11/01/2020   CREATININE 0.82 11/01/2020   BILITOT 0.6 11/01/2020   ALKPHOS 64 11/01/2020   AST 15 11/01/2020   ALT 12 11/01/2020   PROT 6.9 11/01/2020   ALBUMIN 4.1 11/01/2020   CALCIUM 9.2 11/01/2020   GFR 84.82 11/01/2020   Lab Results  Component Value Date   CHOL 167 11/01/2020   Lab Results  Component Value Date   HDL 59.20 11/01/2020   Lab  Results  Component Value Date   LDLCALC 90 11/01/2020   Lab Results  Component Value Date   TRIG 93.0 11/01/2020   Lab Results  Component Value Date   CHOLHDL 3 11/01/2020   Lab Results  Component Value Date   HGBA1C 5.4 11/01/2020      Assessment & Plan:   Problem List Items Addressed This Visit       Other   Adult ADHD - Primary    Patient doing well. She has been maintained on Adderrall 30mg  XR. No reports of insomnia due to medication. She has lost some weight but mentioned that it was intentional in nature.       Relevant Medications   amphetamine-dextroamphetamine (ADDERALL XR) 30 MG 24 hr capsule   amphetamine-dextroamphetamine (ADDERALL XR) 30 MG 24 hr capsule   amphetamine-dextroamphetamine (ADDERALL XR) 30 MG 24 hr capsule   Other Visit Diagnoses     High risk medication use       Relevant Orders   DRUG MONITORING, PANEL 8 WITH CONFIRMATION, URINE       Meds ordered this encounter  Medications   amphetamine-dextroamphetamine (ADDERALL XR) 30 MG 24 hr capsule    Sig: Take 1 capsule (30 mg total) by mouth daily.    Dispense:  30 capsule    Refill:  0    May fill on 07/17/2021    Order Specific Question:   Supervising Provider    Answer:   07/19/2021 A [[id number]]   amphetamine-dextroamphetamine (ADDERALL XR) 30 MG 24 hr capsule    Sig: Take 1 capsule (30 mg total) by mouth daily.    Dispense:  30 capsule    Refill:  0    May fill 06/19/2021    Order Specific Question:   Supervising Provider    Answer:   06/21/2021 A [[id number]]   amphetamine-dextroamphetamine (ADDERALL XR) 30 MG 24 hr capsule    Sig: Take 1 capsule (30 mg total) by mouth daily.    Dispense:  30 capsule    Refill:  0    May fill 05/22/2021    Order Specific Question:   Supervising Provider    Answer:   05/24/2021 A [[id number]]    Follow-up: Return in about 6 months (around 11/02/2021) for CPE.   This visit occurred during the SARS-CoV-2 public health emergency.  Safety protocols were  in place, including screening questions prior to the visit, additional usage of staff PPE, and extensive cleaning of exam room while observing appropriate contact time as indicated for disinfecting solutions.   01/02/2022, NP

## 2021-05-02 NOTE — Patient Instructions (Signed)
Nice to see you today ?I will send in the medication for the 3 months ?Follow up with me in 6 months for your physical, sooner if you need me before ? ?

## 2021-05-04 LAB — DRUG MONITORING, PANEL 8 WITH CONFIRMATION, URINE
6 Acetylmorphine: NEGATIVE ng/mL (ref <10)
Alcohol Metabolites: NEGATIVE ng/mL (ref <500)
Amphetamine: 4283 ng/mL — ABNORMAL HIGH (ref <250)
Amphetamines: POSITIVE ng/mL — AB (ref <500)
Benzodiazepines: NEGATIVE ng/mL (ref <100)
Buprenorphine, Urine: NEGATIVE ng/mL (ref <5)
Cocaine Metabolite: NEGATIVE ng/mL (ref <150)
Creatinine: 70.2 mg/dL (ref >=20.0)
MDMA: NEGATIVE ng/mL (ref <500)
Marijuana Metabolite: NEGATIVE ng/mL (ref <20)
Methamphetamine: NEGATIVE ng/mL (ref <250)
Opiates: NEGATIVE ng/mL (ref <100)
Oxidant: NEGATIVE ug/mL (ref <200)
Oxycodone: NEGATIVE ng/mL (ref <100)
pH: 6.6 (ref 4.5–9.0)

## 2021-05-04 LAB — DM TEMPLATE

## 2021-05-22 ENCOUNTER — Other Ambulatory Visit: Payer: Self-pay | Admitting: Nurse Practitioner

## 2021-05-22 DIAGNOSIS — F909 Attention-deficit hyperactivity disorder, unspecified type: Secondary | ICD-10-CM

## 2021-05-23 ENCOUNTER — Encounter: Payer: Self-pay | Admitting: Nurse Practitioner

## 2021-05-23 ENCOUNTER — Other Ambulatory Visit: Payer: Self-pay | Admitting: Nurse Practitioner

## 2021-05-23 DIAGNOSIS — F909 Attention-deficit hyperactivity disorder, unspecified type: Secondary | ICD-10-CM

## 2021-05-23 MED ORDER — AMPHETAMINE-DEXTROAMPHET ER 30 MG PO CP24: 30.0000 mg | ORAL_CAPSULE | Freq: Every day | ORAL | 0 refills | Status: DC

## 2021-05-23 NOTE — Telephone Encounter (Signed)
[facility] called [facility] and cancelled script was due now. I called [facility] pharmacy because I saw that the script that was sent to them had a note stating not to fill till 07/17/21-wrong script pulled down, I called and told them to disregard that. Also when I called I found out that they do not have generic Adderall only brand name and not sure if patient wants that or insurance will even cover. They dont have her information. I am sending mychart message to the patient about this to see how she wants to proceed. ?

## 2021-05-23 NOTE — Telephone Encounter (Signed)
Pt called in stated [facility] has RX on stock ?Encourage patient to contact the pharmacy for refills or they can request refills through [facility] ? ?LAST APPOINTMENT DATE:  Please schedule appointment if longer than 1 year ? ?NEXT APPOINTMENT DATE: ? ?MEDICATION:amphetamine-dextroamphetamine (ADDERALL XR) 30 MG 24 hr capsule ? ?Is the patient out of medication?  ? ?PHARMACY:[facility] [address] [place]  ? ?Let patient know to contact pharmacy at the end of the day to make sure medication is ready. ? ?Please notify patient to allow 48-72 hours to process ? ?CLINICAL FILLS OUT ALL BELOW:  ? ?LAST REFILL: ? ?QTY: ? ?REFILL DATE: ? ? ? ?OTHER COMMENTS:  ? ? ?Okay for refill? ? ?Please advise ? ? ? ? ?

## 2021-05-23 NOTE — Telephone Encounter (Signed)
We will go with the canceling the script since I sent it to another pharmacy already. Thank you for checking ?

## 2021-05-23 NOTE — Telephone Encounter (Signed)
Can we call the pharmacy on file and cancel the script that is due now for her adderral and I will send a new script into the [facility] at [place] ?

## 2021-05-24 NOTE — Telephone Encounter (Signed)
Spoke with patient and she did pick up Adderall RX-generic yesterday. Did not have any issues with the medication. ?

## 2021-06-09 ENCOUNTER — Encounter: Payer: Self-pay | Admitting: Nurse Practitioner

## 2021-06-23 ENCOUNTER — Ambulatory Visit (INDEPENDENT_AMBULATORY_CARE_PROVIDER_SITE_OTHER)
Admission: RE | Admit: 2021-06-23 | Discharge: 2021-06-23 | Disposition: A | Discharge status: Home / Self Care | Payer: BC Managed Care – PPO | Source: Ambulatory Visit | Attending: Nurse Practitioner | Admitting: Nurse Practitioner

## 2021-06-23 ENCOUNTER — Ambulatory Visit: Payer: BC Managed Care – PPO | Admitting: Nurse Practitioner

## 2021-06-23 VITALS — BP 120/76 | HR 91 | Temp 97.6°F | Resp 14 | Ht 67.0 in | Wt 158.5 lb

## 2021-06-23 DIAGNOSIS — Z87442 Personal history of urinary calculi: Secondary | ICD-10-CM | POA: Diagnosis not present

## 2021-06-23 DIAGNOSIS — N3090 Cystitis, unspecified without hematuria: Secondary | ICD-10-CM

## 2021-06-23 DIAGNOSIS — N200 Calculus of kidney: Secondary | ICD-10-CM | POA: Diagnosis not present

## 2021-06-23 DIAGNOSIS — R3915 Urgency of urination: Secondary | ICD-10-CM | POA: Diagnosis not present

## 2021-06-23 LAB — POCT URINALYSIS DIPSTICK
Bilirubin, UA: NEGATIVE
Blood, UA: NEGATIVE
Glucose, UA: NEGATIVE
Ketones, UA: NEGATIVE
Nitrite, UA: NEGATIVE
Protein, UA: NEGATIVE
Spec Grav, UA: 1.02 (ref 1.010–1.025)
Urobilinogen, UA: 0.2 E.U./dL
pH, UA: 5 (ref 5.0–8.0)

## 2021-06-23 LAB — POCT URINE PREGNANCY: Preg Test, Ur: NEGATIVE

## 2021-06-23 MED ORDER — SULFAMETHOXAZOLE-TRIMETHOPRIM 800-160 MG PO TABS: 1.0000 | ORAL_TABLET | Freq: Two times a day (BID) | ORAL | 0 refills | Status: AC

## 2021-06-23 NOTE — Patient Instructions (Signed)
Nice to see you today ?I will be in touch with xray and urine culture results ?Follow up if no improvement ?

## 2021-06-23 NOTE — Progress Notes (Signed)
? ?Acute Office Visit ? ?Subjective:  ? ?  ?Patient ID: Valerie Cole, female    DOB: [birth year], 51 y.o.   MRN: [id number] ? ?Chief Complaint  ?Patient presents with  ? Urinary Frequency  ?  Started on April 7th with sharp pain in the kidney area-thought maybe her kidney stones passed, Burning with urination off and on, pelvic pressure/pain, urinary urgency. Hx of kidney stones. Patient did telehealth on 06/03/21 and was started on Macrobid x 5 days but did not help the symptoms.  ? ? ?HPI ?Patient is in today for Urinary complaints ? ?States started on 06/02/2021 in her kidney area but passed. Dysuria intermittently with abdominal pressure ? ?States that she was evaluated on 06/03/2021 through tele health and give macrobid, it helped with the symptoms. Two days after the abx she developed symptoms. ?States she has intermittent back pain. It can be stabbing or dull. ?States that she has tried promethazine and azo both that helped ? ?Review of Systems  ?Constitutional:  Negative for chills and fever.  ?Gastrointestinal:  Positive for abdominal pain and nausea. Negative for diarrhea and vomiting.  ?Genitourinary:  Positive for dysuria, frequency and urgency.  ?Musculoskeletal:  Positive for back pain.  ? ? ?   ?Objective:  ?  ?BP 120/76   Pulse 91   Temp 97.6 ?F (36.4 ?C)   Resp 14   Ht 5\' 7"  (1.702 m)   Wt 158 lb 8 oz (71.9 kg)   LMP 06/05/2021   SpO2 97%   BMI 24.82 kg/m?  ? ? ?Physical Exam ?Vitals and nursing note reviewed.  ?Constitutional:   ?   Appearance: Normal appearance.  ?Cardiovascular:  ?   Rate and Rhythm: Normal rate and regular rhythm.  ?   Heart sounds: Normal heart sounds.  ?Pulmonary:  ?   Breath sounds: Normal breath sounds.  ?Abdominal:  ?   General: Bowel sounds are normal. There is no distension.  ?   Palpations: There is no mass.  ?   Tenderness: There is abdominal tenderness. There is no right CVA tenderness or left CVA tenderness.  ?   Hernia: No hernia is present.  ? ? ?Skin: ?    General: Skin is warm.  ?Neurological:  ?   Mental Status: She is alert.  ? ? ?Results for orders placed or performed in visit on 06/23/21  ?POCT urinalysis dipstick  ?Result Value Ref Range  ? Color, UA yellow   ? Clarity, UA clear   ? Glucose, UA Negative Negative  ? Bilirubin, UA negative   ? Ketones, UA negative   ? Spec Grav, UA 1.020 1.010 - 1.025  ? Blood, UA negative   ? pH, UA 5.0 5.0 - 8.0  ? Protein, UA Negative Negative  ? Urobilinogen, UA 0.2 0.2 or 1.0 E.U./dL  ? Nitrite, UA negative   ? Leukocytes, UA Moderate (2+) (A) Negative  ? Appearance    ? Odor    ?POCT urine pregnancy  ?Result Value Ref Range  ? Preg Test, Ur Negative Negative  ? ? ? ?   ?Assessment & Plan:  ? ?Problem List Items Addressed This Visit   ? ?  ? Genitourinary  ? Cystitis  ?  UA indicative of urinary tract infection.  Recently treated with Macrobid that seem to work while taking antibiotics but the symptoms recurred shortly after.  Do not have a very recent urine culture on file as patient was treated via telehealth visit previously.  We  will choose Bactrim 1 tablet twice daily for 5 days for patient's treatment.  Pending urine culture follow-up if no improvement ? ?  ?  ? Relevant Medications  ? sulfamethoxazole-trimethoprim (BACTRIM DS) 800-160 MG tablet  ? Other Relevant Orders  ? POCT urine pregnancy (Completed)  ?  ? Other  ? History of kidney stones  ?  Extensive history of kidney stones.  Patient is concerned that she is having kidney stone issues.  UA was negative for blood.  We will obtain abdominal x-ray to make sure no nephrolithiasis and ureter or anywhere else along the kidney tract.  Follow-up if no improvement ? ?  ?  ? Relevant Orders  ? DG Abd 1 View  ? POCT urine pregnancy (Completed)  ? Urinary urgency - Primary  ?  UA indicative of urinary tract infection. ? ?  ?  ? Relevant Orders  ? POCT urinalysis dipstick (Completed)  ? Urine Culture  ? ? ?Meds ordered this encounter  ?Medications  ?  sulfamethoxazole-trimethoprim (BACTRIM DS) 800-160 MG tablet  ?  Sig: Take 1 tablet by mouth 2 (two) times daily for 5 days.  ?  Dispense:  10 tablet  ?  Refill:  0  ?  Order Specific Question:   Supervising Provider  ?  Answer:   Roxy Manns A [[id number]]  ? ? ?No follow-ups on file. ? ?Audria Nine, NP ? ? ?

## 2021-06-24 DIAGNOSIS — N3090 Cystitis, unspecified without hematuria: Secondary | ICD-10-CM | POA: Insufficient documentation

## 2021-06-24 DIAGNOSIS — R3915 Urgency of urination: Secondary | ICD-10-CM | POA: Insufficient documentation

## 2021-06-24 LAB — URINE CULTURE
MICRO NUMBER:: 13326593
Result:: NO GROWTH
SPECIMEN QUALITY:: ADEQUATE

## 2021-06-24 NOTE — Assessment & Plan Note (Signed)
UA indicative of urinary tract infection.  Recently treated with Macrobid that seem to work while taking antibiotics but the symptoms recurred shortly after.  Do not have a very recent urine culture on file as patient was treated via telehealth visit previously.  We will choose Bactrim 1 tablet twice daily for 5 days for patient's treatment.  Pending urine culture follow-up if no improvement ?

## 2021-06-24 NOTE — Assessment & Plan Note (Signed)
UA indicative of urinary tract infection. 

## 2021-06-24 NOTE — Assessment & Plan Note (Signed)
Extensive history of kidney stones.  Patient is concerned that she is having kidney stone issues.  UA was negative for blood.  We will obtain abdominal x-ray to make sure no nephrolithiasis and ureter or anywhere else along the kidney tract.  Follow-up if no improvement ?

## 2021-06-26 ENCOUNTER — Other Ambulatory Visit: Payer: Self-pay | Admitting: Nurse Practitioner

## 2021-06-26 ENCOUNTER — Telehealth: Payer: Self-pay | Admitting: Nurse Practitioner

## 2021-06-26 ENCOUNTER — Telehealth: Payer: Self-pay

## 2021-06-26 ENCOUNTER — Encounter: Payer: Self-pay | Admitting: Nurse Practitioner

## 2021-06-26 DIAGNOSIS — R1030 Lower abdominal pain, unspecified: Secondary | ICD-10-CM

## 2021-06-26 DIAGNOSIS — R110 Nausea: Secondary | ICD-10-CM

## 2021-06-26 DIAGNOSIS — Z87442 Personal history of urinary calculi: Secondary | ICD-10-CM

## 2021-06-26 MED ORDER — ONDANSETRON HCL 4 MG PO TABS: 4.0000 mg | ORAL_TABLET | Freq: Three times a day (TID) | ORAL | 0 refills | Status: DC | PRN

## 2021-06-26 MED ORDER — HYDROCODONE-ACETAMINOPHEN 10-325 MG PO TABS: 0.5000 | ORAL_TABLET | Freq: Four times a day (QID) | ORAL | 0 refills | Status: AC | PRN

## 2021-06-26 MED ORDER — HYDROCODONE-ACETAMINOPHEN 5-325 MG PO TABS: 1.0000 | ORAL_TABLET | Freq: Four times a day (QID) | ORAL | 0 refills | Status: DC | PRN

## 2021-06-26 NOTE — Telephone Encounter (Signed)
Noted. I have sent you her a my chart message and sent you a message to review the info with her and check on her symptoms. ?

## 2021-06-26 NOTE — Telephone Encounter (Signed)
Patient called to follow up. Norco 5-325 mg national backorder and wanted to see if she can get a different dose and cut in half? ? ?Patient also states when she tried filling Zofran per pharmacist it said quantity limit issue. Patient states she thinks Zofran upset her stomach when she took in the past about 2 years ago when she was out of town and had to have medication for kidney stone but has taking Phenergan and that helped and she tolerated that better. Patient wanted to see if she could get that instead and for less tablets so insurance would cover. ?

## 2021-06-26 NOTE — Telephone Encounter (Signed)
See mychart.  

## 2021-06-26 NOTE — Telephone Encounter (Signed)
Called in the hydrocodone 10/325 that she can take half a tablet every 6 hours as needed. I would like to stick with zofran and I did decrease the amount. It shows it should be covered in the computer ?

## 2021-06-26 NOTE — Telephone Encounter (Signed)
[facility] after this message was sent and was advised they do have 5-325 mg in stock and will dispense that and cancel Norco for 10-325 mg that was sent in. I spoke with Valerie Cole twice. [facility] and was advised that Norco 5-325 mg was filled, Zofran is not been covered even trying 6 and 10 tablets to dispense with no success. They will put this under cash pay and use a co pay card but could not tell me how much this will be. Valerie Cole was advised and will see if she can get this. Valerie Cole states she has Phenergan left over -about 3 or 4 tablets- from before and will take that if she needs it tonight and not able to afford Zofran. She took Phenergan last night which is expired and noticed if wears off fast and Valerie Cole feels groggy some. FYI ?

## 2021-06-26 NOTE — Telephone Encounter (Signed)
Received call report on x ray from 4/28.  ?Call received from [facility] at [facility] radiology. Call back number [phone]. Teams sent to provider and CMA to let know that phone note will be sent for review.  ? ? ?

## 2021-06-26 NOTE — Telephone Encounter (Signed)
See mychart message from patient.

## 2021-06-27 ENCOUNTER — Other Ambulatory Visit: Payer: Self-pay

## 2021-06-27 ENCOUNTER — Ambulatory Visit
Admission: RE | Admit: 2021-06-27 | Discharge: 2021-06-27 | Disposition: A | Discharge status: Home / Self Care | Payer: BC Managed Care – PPO | Source: Ambulatory Visit | Attending: Nurse Practitioner | Admitting: Nurse Practitioner

## 2021-06-27 DIAGNOSIS — R1030 Lower abdominal pain, unspecified: Secondary | ICD-10-CM | POA: Diagnosis not present

## 2021-06-27 DIAGNOSIS — Z87442 Personal history of urinary calculi: Secondary | ICD-10-CM | POA: Diagnosis not present

## 2021-06-27 DIAGNOSIS — N200 Calculus of kidney: Secondary | ICD-10-CM | POA: Diagnosis not present

## 2021-06-27 DIAGNOSIS — R300 Dysuria: Secondary | ICD-10-CM | POA: Diagnosis not present

## 2021-06-27 DIAGNOSIS — R350 Frequency of micturition: Secondary | ICD-10-CM | POA: Diagnosis not present

## 2021-06-27 DIAGNOSIS — N201 Calculus of ureter: Secondary | ICD-10-CM | POA: Diagnosis not present

## 2021-06-27 NOTE — Telephone Encounter (Signed)
Spoke with patient to follow up. Patient states she had some pain around 3 am and took Norco then. Has some pain now but she has not taking anything for pain right now. She is on the way for her CT scan right now. She was able to get Zofran at the pharmacy last night and insurance did cover. Took Zofran yesterday and no nausea at this time.  ?

## 2021-06-28 ENCOUNTER — Encounter: Payer: Self-pay | Admitting: Nurse Practitioner

## 2021-06-28 ENCOUNTER — Other Ambulatory Visit: Payer: Self-pay | Admitting: Nurse Practitioner

## 2021-06-28 DIAGNOSIS — N200 Calculus of kidney: Secondary | ICD-10-CM

## 2021-07-06 ENCOUNTER — Encounter: Payer: Self-pay | Admitting: Urology

## 2021-07-06 ENCOUNTER — Ambulatory Visit (INDEPENDENT_AMBULATORY_CARE_PROVIDER_SITE_OTHER): Payer: BC Managed Care – PPO | Admitting: Urology

## 2021-07-06 VITALS — BP 108/76 | HR 102 | Ht 67.0 in | Wt 159.0 lb

## 2021-07-06 DIAGNOSIS — N202 Calculus of kidney with calculus of ureter: Secondary | ICD-10-CM

## 2021-07-06 DIAGNOSIS — N200 Calculus of kidney: Secondary | ICD-10-CM

## 2021-07-06 LAB — URINALYSIS, COMPLETE
Bilirubin, UA: NEGATIVE
Glucose, UA: NEGATIVE
Ketones, UA: NEGATIVE
Nitrite, UA: NEGATIVE
Protein,UA: NEGATIVE
Specific Gravity, UA: 1.025 (ref 1.005–1.030)
Urobilinogen, Ur: 0.2 mg/dL (ref 0.2–1.0)
pH, UA: 6 (ref 5.0–7.5)

## 2021-07-06 LAB — MICROSCOPIC EXAMINATION

## 2021-07-06 NOTE — Progress Notes (Signed)
? ?07/06/2021 ?2:13 PM  ? ?Valerie Cole ?17-Mar-1970 ?[id number] ? ?Referring provider:  ?Eden Emms, NP ?[address] [place] ?[place],  [place] [address] ?Chief Complaint  ?Patient presents with  ? Nephrolithiasis  ? ? ?HPI: ?Valerie Cole is a 51 y.o.female who presents today for further evaluation of nephrolithiasis.  ? ?She has an extensive history of kidney stones. She reports that she has a long history of stones she is s/p lithotripsy and kidney stent. She has had a cesarean section but otherwise no other major surgeries. She had a stone when she was pregnant.  She was previously seeing Dr Logan Bores in urology who felt that her stones were pregnancy-induced.. She had a CT scan in 2014 and she was told she had multiple stones that were small and stable. She was also seeing urology in [place] and she was told her stones were small and did not need treatment.  ? ?She was seen by her PCP, Dr Toney Reil on 06/23/2021. She has urinary complaints she had pain on 06/02/2021 in her kidneys and dysuria intermittently with pressure. She was prescribed Macrobid on 06/03/2021 during telehealth visit. UA showed moderate leukocytes but otherwise unremarkable. She was prescribed bactrim.  ? ?She underwent a CT renal stone study on 06/27/2021 to further evaluate flank pain urinary frequency and dysuria. It visualized  a 5 mm distal left ureteral calculus near the left UVJ, without significant hydroureteronephrosis. Bilateral nephrolithiasis. ? ?CT renal stone was personally reviewed and showed largest stone measuring up to 9 mm. It is  930 Hounsfield units and the stone to skin is 8.5 cm.  ?  ? She reports that on 06/02/2021 she had severe pain in her kidneys. She reports localized pain in her left and right flank. She reports that she has burning during urination. She has not been taking antibiotics; was briefly on a short course with a told to stop it once her urine culture resulted negative. ? ?She denies fever and chills. She does  have nausea.  ? ?She spoke about a 24 hour metabolic exam with Dr Logan Bores but never underwent one.  She is interested in this in the future. ? ?PMH: ?Past Medical History:  ?Diagnosis Date  ? Anxiety   ? Factor 5 Leiden mutation, heterozygous (HCC)   ? ? ?Surgical History: ?Past Surgical History:  ?Procedure Laterality Date  ? CESAREAN SECTION    ? colapsed lung    ? kidney stint    ? kidney stent  ? LITHOTRIPSY    ? ? ?Home Medications:  ?Allergies as of 07/06/2021   ? ?   Reactions  ? Focalin Xr [dexmethylphenidate Hcl Er] Nausea Only  ? And broke out in sweat when taking on empty stomach.  ? Demerol Itching  ? ?  ? ?  ?Medication List  ?  ? ?  ? Accurate as of Jul 06, 2021  2:13 PM. If you have any questions, ask your nurse or doctor.  ?  ?  ? ?  ? ?amphetamine-dextroamphetamine 30 MG 24 hr capsule ?Commonly known as: Adderall XR ?Take 1 capsule (30 mg total) by mouth daily. ?  ?amphetamine-dextroamphetamine 30 MG 24 hr capsule ?Commonly known as: Adderall XR ?Take 1 capsule (30 mg total) by mouth daily. ?  ?amphetamine-dextroamphetamine 30 MG 24 hr capsule ?Commonly known as: ADDERALL XR ?Take 1 capsule (30 mg total) by mouth daily. ?  ?cetirizine 10 MG tablet ?Commonly known as: ZYRTEC ?Take 10 mg by mouth daily. ?  ?  clindamycin 300 MG capsule ?Commonly known as: CLEOCIN ?Take 300 mg by mouth 3 (three) times daily. ?  ?ondansetron 4 MG tablet ?Commonly known as: Zofran ?Take 1 tablet (4 mg total) by mouth every 8 (eight) hours as needed for nausea or vomiting. ?  ?OVER THE COUNTER MEDICATION ?HORMONE HARMONY ?  ?Vitamin D 50 MCG (2000 UT) tablet ?Take 2,000 Units by mouth daily. ?  ? ?  ? ? ?Allergies:  ?Allergies  ?Allergen Reactions  ? Focalin Xr [Dexmethylphenidate Hcl Er] Nausea Only  ?  And broke out in sweat when taking on empty stomach.  ? Demerol Itching  ? ? ?Family History: ?Family History  ?Problem Relation Age of Onset  ? Hypertension Mother   ? Breast cancer Mother   ? Arthritis Mother   ? Heart  disease Mother   ? Hyperlipidemia Mother   ? Diabetes Mother   ? Arthritis Father   ? Diabetes Father   ? Hypertension Maternal Grandmother   ? Arthritis Maternal Grandmother   ? Hyperlipidemia Maternal Grandmother   ? Heart disease Maternal Grandmother   ? Heart disease Paternal Grandfather   ? ? ?Social History:  reports that she has never smoked. She has never used smokeless tobacco. She reports that she does not drink alcohol and does not use drugs. ? ? ?Physical Exam: ?BP 108/76   Pulse (!) 102   Ht 5\' 7"  (1.702 m)   Wt 159 lb (72.1 kg)   BMI 24.90 kg/m?   ?Constitutional:  Alert and oriented, No acute distress. ?HEENT:  AT, moist mucus membranes.  Trachea midline, no masses. ?Cardiovascular: No clubbing, cyanosis, or edema. ?Respiratory: Normal respiratory effort, no increased work of breathing. ?Skin: No rashes, bruises or suspicious lesions. ?Neurologic: Grossly intact, no focal deficits, moving all 4 extremities. ?Psychiatric: Normal mood and affect. ? ?Laboratory Data: ? ?Lab Results  ?Component Value Date  ? CREATININE 0.82 11/01/2020  ? ?Lab Results  ?Component Value Date  ? HGBA1C 5.4 11/01/2020  ? ? ?Urinalysis ?Results for orders placed or performed in visit on 06/23/21  ?Urine Culture  ? Specimen: Urine  ?Result Value Ref Range  ? MICRO NUMBER: 06/25/21   ? SPECIMEN QUALITY: Adequate   ? Sample Source URINE   ? STATUS: FINAL   ? Result: No Growth   ?POCT urinalysis dipstick  ?Result Value Ref Range  ? Color, UA yellow   ? Clarity, UA clear   ? Glucose, UA Negative Negative  ? Bilirubin, UA negative   ? Ketones, UA negative   ? Spec Grav, UA 1.020 1.010 - 1.025  ? Blood, UA negative   ? pH, UA 5.0 5.0 - 8.0  ? Protein, UA Negative Negative  ? Urobilinogen, UA 0.2 0.2 or 1.0 E.U./dL  ? Nitrite, UA negative   ? Leukocytes, UA Moderate (2+) (A) Negative  ? Appearance    ? Odor    ?POCT urine pregnancy  ?Result Value Ref Range  ? Preg Test, Ur Negative Negative  ? ? ?Pertinent Imaging: ?CLINICAL  DATA:  Bilateral flank pain. Urinary frequency and dysuria. ?Nephrolithiasis. ?  ?EXAM: ?CT ABDOMEN AND PELVIS WITHOUT CONTRAST ?  ?TECHNIQUE: ?Multidetector CT imaging of the abdomen and pelvis was performed ?following the standard protocol without IV contrast. ?  ?RADIATION DOSE REDUCTION: This exam was performed according to the ?departmental dose-optimization program which includes automated ?exposure control, adjustment of the mA and/or kV according to ?patient size and/or use of iterative reconstruction technique. ?  ?COMPARISON:  04/09/2012 from [facility] ?  ?FINDINGS: ?Lower chest: No acute findings. Stable marked elevation of right ?hemidiaphragm. ?  ?Hepatobiliary: A few tiny sub-cm low-attenuation lesions are too ?small to characterize but most likely represent tiny cysts. No ?definite mass visualized on this unenhanced exam. Gallbladder is ?unremarkable. No evidence of biliary ductal dilatation. ?  ?Pancreas: No mass or inflammatory process visualized on this ?unenhanced exam. ?  ?Spleen:  Within normal limits in size. ?  ?Adrenals/Urinary tract: Multiple small less than 1 cm renal calculi ?are again seen bilaterally. A 5 mm calculus is seen in the distal ?left ureter near the left UVJ, however there is no significant ?hydroureteronephrosis. ?  ?Stomach/Bowel: No evidence of obstruction, inflammatory process, or ?abnormal fluid collections. Normal appendix visualized. ?  ?Vascular/Lymphatic: No pathologically enlarged lymph nodes ?identified. No evidence of abdominal aortic aneurysm. ?  ?Reproductive: No mass or other significant abnormality. Clips are ?seen from previous bilateral tubal ligation. ?  ?Other:  None. ?  ?Musculoskeletal:  No suspicious bone lesions identified. ?  ?IMPRESSION: ?5 mm distal left ureteral calculus near the left UVJ, without ?significant hydroureteronephrosis. ?  ?Bilateral nephrolithiasis. ?  ?  ?Electronically Signed ?  By: Danae OrleansJohn A Stahl M.D. ?  On:  06/27/2021 16:40 ?CT scan was personally reviewed. ? ?Assessment & Plan:   ?Left distal ureteral stone ?Based on her ongoing pain, particularly in her low pelvis rating to her vagina, is likely that she has a retained st

## 2021-07-06 NOTE — H&P (View-Only) (Signed)
07/06/2021 2:13 PM   Valerie Cole 06/29/1970 [id number]  Referring provider:  Eden Emms, NP [address] [place],  [place] [address] Chief Complaint  Patient presents with   Nephrolithiasis    HPI: Valerie Cole is a 51 y.o.female who presents today for further evaluation of nephrolithiasis.   She has an extensive history of kidney stones. She reports that she has a long history of stones she is s/p lithotripsy and kidney stent. She has had a cesarean section but otherwise no other major surgeries. She had a stone when she was pregnant.  She was previously seeing Dr Logan Bores in urology who felt that her stones were pregnancy-induced.. She had a CT scan in 2014 and she was told she had multiple stones that were small and stable. She was also seeing urology in [place] and she was told her stones were small and did not need treatment.   She was seen by her PCP, Dr Toney Reil on 06/23/2021. She has urinary complaints she had pain on 06/02/2021 in her kidneys and dysuria intermittently with pressure. She was prescribed Macrobid on 06/03/2021 during telehealth visit. UA showed moderate leukocytes but otherwise unremarkable. She was prescribed bactrim.   She underwent a CT renal stone study on 06/27/2021 to further evaluate flank pain urinary frequency and dysuria. It visualized  a 5 mm distal left ureteral calculus near the left UVJ, without significant hydroureteronephrosis. Bilateral nephrolithiasis.  CT renal stone was personally reviewed and showed largest stone measuring up to 9 mm. It is  930 Hounsfield units and the stone to skin is 8.5 cm.     She reports that on 06/02/2021 she had severe pain in her kidneys. She reports localized pain in her left and right flank. She reports that she has burning during urination. She has not been taking antibiotics; was briefly on a short course with a told to stop it once her urine culture resulted negative.  She denies fever and chills. She does  have nausea.   She spoke about a 24 hour metabolic exam with Dr Logan Bores but never underwent one.  She is interested in this in the future.  PMH: Past Medical History:  Diagnosis Date   Anxiety    Factor 5 Leiden mutation, heterozygous [facility])     Surgical History: Past Surgical History:  Procedure Laterality Date   CESAREAN SECTION     colapsed lung     kidney stint     kidney stent   LITHOTRIPSY      Home Medications:  Allergies as of 07/06/2021       Reactions   Focalin Xr [dexmethylphenidate Hcl Er] Nausea Only   And broke out in sweat when taking on empty stomach.   Demerol Itching        Medication List        Accurate as of Jul 06, 2021  2:13 PM. If you have any questions, ask your nurse or doctor.          amphetamine-dextroamphetamine 30 MG 24 hr capsule Commonly known as: Adderall XR Take 1 capsule (30 mg total) by mouth daily.   amphetamine-dextroamphetamine 30 MG 24 hr capsule Commonly known as: Adderall XR Take 1 capsule (30 mg total) by mouth daily.   amphetamine-dextroamphetamine 30 MG 24 hr capsule Commonly known as: ADDERALL XR Take 1 capsule (30 mg total) by mouth daily.   cetirizine 10 MG tablet Commonly known as: ZYRTEC Take 10 mg by mouth daily.  clindamycin 300 MG capsule Commonly known as: CLEOCIN Take 300 mg by mouth 3 (three) times daily.   ondansetron 4 MG tablet Commonly known as: Zofran Take 1 tablet (4 mg total) by mouth every 8 (eight) hours as needed for nausea or vomiting.   OVER THE COUNTER MEDICATION HORMONE HARMONY   Vitamin D 50 MCG (2000 UT) tablet Take 2,000 Units by mouth daily.        Allergies:  Allergies  Allergen Reactions   Focalin Xr [Dexmethylphenidate Hcl Er] Nausea Only    And broke out in sweat when taking on empty stomach.   Demerol Itching    Family History: Family History  Problem Relation Age of Onset   Hypertension Mother    Breast cancer Mother    Arthritis Mother    Heart  disease Mother    Hyperlipidemia Mother    Diabetes Mother    Arthritis Father    Diabetes Father    Hypertension Maternal Grandmother    Arthritis Maternal Grandmother    Hyperlipidemia Maternal Grandmother    Heart disease Maternal Grandmother    Heart disease Paternal Grandfather     Social History:  reports that she has never smoked. She has never used smokeless tobacco. She reports that she does not drink alcohol and does not use drugs.   Physical Exam: BP 108/76   Pulse (!) 102   Ht 5\' 7"  (1.702 m)   Wt 159 lb (72.1 kg)   BMI 24.90 kg/m   Constitutional:  Alert and oriented, No acute distress. HEENT: [place] AT, moist mucus membranes.  Trachea midline, no masses. Cardiovascular: No clubbing, cyanosis, or edema. Respiratory: Normal respiratory effort, no increased work of breathing. Skin: No rashes, bruises or suspicious lesions. Neurologic: Grossly intact, no focal deficits, moving all 4 extremities. Psychiatric: Normal mood and affect.  Laboratory Data:  Lab Results  Component Value Date   CREATININE 0.82 11/01/2020   Lab Results  Component Value Date   HGBA1C 5.4 11/01/2020    Urinalysis Results for orders placed or performed in visit on 06/23/21  Urine Culture   Specimen: Urine  Result Value Ref Range   MICRO NUMBER: [id number]    SPECIMEN QUALITY: Adequate    Sample Source URINE    STATUS: FINAL    Result: No Growth   POCT urinalysis dipstick  Result Value Ref Range   Color, UA yellow    Clarity, UA clear    Glucose, UA Negative Negative   Bilirubin, UA negative    Ketones, UA negative    Spec Grav, UA 1.020 1.010 - 1.025   Blood, UA negative    pH, UA 5.0 5.0 - 8.0   Protein, UA Negative Negative   Urobilinogen, UA 0.2 0.2 or 1.0 E.U./dL   Nitrite, UA negative    Leukocytes, UA Moderate (2+) (A) Negative   Appearance     Odor    POCT urine pregnancy  Result Value Ref Range   Preg Test, Ur Negative Negative    Pertinent Imaging: CLINICAL  DATA:  Bilateral flank pain. Urinary frequency and dysuria. Nephrolithiasis.   EXAM: CT ABDOMEN AND PELVIS WITHOUT CONTRAST   TECHNIQUE: Multidetector CT imaging of the abdomen and pelvis was performed following the standard protocol without IV contrast.   RADIATION DOSE REDUCTION: This exam was performed according to the departmental dose-optimization program which includes automated exposure control, adjustment of the mA and/or kV according to patient size and/or use of iterative reconstruction technique.   COMPARISON:  04/09/2012 from [facility]   FINDINGS: Lower chest: No acute findings. Stable marked elevation of right hemidiaphragm.   Hepatobiliary: A few tiny sub-cm low-attenuation lesions are too small to characterize but most likely represent tiny cysts. No definite mass visualized on this unenhanced exam. Gallbladder is unremarkable. No evidence of biliary ductal dilatation.   Pancreas: No mass or inflammatory process visualized on this unenhanced exam.   Spleen:  Within normal limits in size.   Adrenals/Urinary tract: Multiple small less than 1 cm renal calculi are again seen bilaterally. A 5 mm calculus is seen in the distal left ureter near the left UVJ, however there is no significant hydroureteronephrosis.   Stomach/Bowel: No evidence of obstruction, inflammatory process, or abnormal fluid collections. Normal appendix visualized.   Vascular/Lymphatic: No pathologically enlarged lymph nodes identified. No evidence of abdominal aortic aneurysm.   Reproductive: No mass or other significant abnormality. Clips are seen from previous bilateral tubal ligation.   Other:  None.   Musculoskeletal:  No suspicious bone lesions identified.   IMPRESSION: 5 mm distal left ureteral calculus near the left UVJ, without significant hydroureteronephrosis.   Bilateral nephrolithiasis.     Electronically Signed   By: Danae Orleans M.D.   On:  06/27/2021 16:40 CT scan was personally reviewed.  Assessment & Plan:   Left distal ureteral stone Based on her ongoing pain, particularly in her low pelvis rating to her vagina, is likely that she has a retained stone.  On the coronal images, her stone is actually larger than 5 mm.  As such, would recommend treatment.  We discussed various treatment options for urolithiasis including observation with or without medical expulsive therapy, shockwave lithotripsy (SWL), ureteroscopy and laser lithotripsy with stent placement, and percutaneous nephrolithotomy.   We discussed that management is based on stone size, location, density, patient co-morbidities, and patient preference.    Stones <22mm in size have a >80% spontaneous passage rate. Data surrounding the use of tamsulosin for medical expulsive therapy is controversial, but meta analyses suggests it is most efficacious for distal stones between 5-32mm in size. Possible side effects include dizziness/lightheadedness, and retrograde ejaculation.   SWL has a lower stone free rate in a single procedure, but also a lower complication rate compared to ureteroscopy and avoids a stent and associated stent related symptoms. Possible complications include renal hematoma, steinstrasse, and need for additional treatment. We discussed the role of his increased skin to stone distance can lead to decreased efficacy with shockwave lithotripsy.   Ureteroscopy with laser lithotripsy and stent placement has a higher stone free rate than SWL in a single procedure, however increased complication rate including possible infection, ureteral injury, bleeding, and stent related morbidity. Common stent related symptoms include dysuria, urgency/frequency, and flank pain.   After an extensive discussion of the risks and benefits of the above treatment options, the patient would like to proceed with ESWL.   Notably, she is a good candidate for this based on her symptoms and  distance and Hounsfield units as well as previously doing well with this procedure. - Urine sent for pre-op culture.   2.  Bilateral nonobstructing stones Multiple bilateral stones, may be helpful to try to obtain previous imaging  In addition to the above, she would likely benefit for 24-hour urine metabolic evaluation to ensure that she is not metabolically active and also continued surveillance for her stones  Also plan to try to send her stone fragments for stone analysis   Schedule left ESWL  Tawni Millers as a Neurosurgeon for Vanna Scotland, MD.,have documented all relevant documentation on the behalf of Vanna Scotland, MD,as directed by  Vanna Scotland, MD while in the presence of Vanna Scotland, MD.  I have reviewed the above documentation for accuracy and completeness, and I agree with the above.   Vanna Scotland, MD   [facility] [address], [address] [place], [place] [address] [phone]

## 2021-07-07 ENCOUNTER — Other Ambulatory Visit: Payer: Self-pay

## 2021-07-07 DIAGNOSIS — N201 Calculus of ureter: Secondary | ICD-10-CM

## 2021-07-07 MED ORDER — ONDANSETRON HCL 4 MG/2ML IJ SOLN: 4.0000 mg | Freq: Once | INTRAMUSCULAR | Status: DC

## 2021-07-07 MED ORDER — CEPHALEXIN 250 MG PO CAPS: 500.0000 mg | ORAL_CAPSULE | Freq: Once | ORAL | Status: DC

## 2021-07-07 NOTE — Progress Notes (Signed)
ESWL ORDER FORM  Expected date of procedure: 07/13/2021  Surgeon: Ashley Brandon, MD  Post op standing: 2-4wk follow up w/KUB prior  Anticoagulation/Aspirin/NSAID standing order: Hold all 72 hours prior  Anesthesia standing order: MAC  VTE standing: SCD's  Dx: Left Ureteral Stone  Procedure: left Extracorporeal shock wave lithotripsy  CPT : 50590  Standing Order Set:   *NPO after mn, KUB  *NS 100ml/hr, Keflex 500mg PO, Benadryl 25mg PO, Valium 10mg PO, Zofran 4mg IV    Medications if other than standing orders:   NONE   

## 2021-07-09 LAB — CULTURE, URINE COMPREHENSIVE

## 2021-07-13 ENCOUNTER — Other Ambulatory Visit: Payer: Self-pay

## 2021-07-13 ENCOUNTER — Encounter
Admission: RE | Disposition: A | Discharge status: Home / Self Care | Payer: Self-pay | Source: Home / Self Care | Attending: Urology

## 2021-07-13 ENCOUNTER — Ambulatory Visit: Payer: BC Managed Care – PPO

## 2021-07-13 ENCOUNTER — Encounter: Payer: Self-pay | Admitting: Urology

## 2021-07-13 ENCOUNTER — Ambulatory Visit
Admission: RE | Admit: 2021-07-13 | Discharge: 2021-07-13 | Disposition: A | Discharge status: Home / Self Care | Payer: BC Managed Care – PPO | Attending: Urology | Admitting: Urology

## 2021-07-13 DIAGNOSIS — N3090 Cystitis, unspecified without hematuria: Secondary | ICD-10-CM

## 2021-07-13 DIAGNOSIS — N201 Calculus of ureter: Secondary | ICD-10-CM | POA: Insufficient documentation

## 2021-07-13 DIAGNOSIS — N200 Calculus of kidney: Secondary | ICD-10-CM | POA: Diagnosis not present

## 2021-07-13 DIAGNOSIS — D6851 Activated protein C resistance: Secondary | ICD-10-CM | POA: Insufficient documentation

## 2021-07-13 DIAGNOSIS — Z9851 Tubal ligation status: Secondary | ICD-10-CM | POA: Diagnosis not present

## 2021-07-13 HISTORY — PX: EXTRACORPOREAL SHOCK WAVE LITHOTRIPSY: SHX1557

## 2021-07-13 SURGERY — LITHOTRIPSY, ESWL
Anesthesia: Moderate Sedation | Laterality: Left

## 2021-07-13 MED ORDER — DIAZEPAM 5 MG PO TABS: 10.0000 mg | ORAL_TABLET | ORAL | Status: AC

## 2021-07-13 MED ORDER — ONDANSETRON HCL 4 MG/2ML IJ SOLN
4.0000 mg | Freq: Once | INTRAMUSCULAR | Status: AC
  Administered 2021-07-13: 4 mg via INTRAVENOUS

## 2021-07-13 MED ORDER — ONDANSETRON HCL 4 MG/2ML IJ SOLN
INTRAMUSCULAR | Status: AC
  Filled 2021-07-13: qty 2

## 2021-07-13 MED ORDER — DIPHENHYDRAMINE HCL 25 MG PO CAPS
ORAL_CAPSULE | ORAL | Status: AC
  Administered 2021-07-13: 25 mg via ORAL
  Filled 2021-07-13: qty 1

## 2021-07-13 MED ORDER — TAMSULOSIN HCL 0.4 MG PO CAPS: 0.4000 mg | ORAL_CAPSULE | Freq: Every day | ORAL | 0 refills | Status: DC

## 2021-07-13 MED ORDER — FAMOTIDINE 20 MG PO TABS
20.0000 mg | ORAL_TABLET | Freq: Once | ORAL | Status: AC
  Administered 2021-07-13: 20 mg via ORAL

## 2021-07-13 MED ORDER — FAMOTIDINE 20 MG PO TABS
ORAL_TABLET | ORAL | Status: AC
  Filled 2021-07-13: qty 1

## 2021-07-13 MED ORDER — OXYCODONE-ACETAMINOPHEN 5-325 MG PO TABS: 1.0000 | ORAL_TABLET | ORAL | 0 refills | Status: DC | PRN

## 2021-07-13 MED ORDER — SODIUM CHLORIDE 0.9 % IV SOLN: INTRAVENOUS | Status: DC

## 2021-07-13 MED ORDER — DIPHENHYDRAMINE HCL 25 MG PO CAPS: 25.0000 mg | ORAL_CAPSULE | ORAL | Status: AC

## 2021-07-13 MED ORDER — DIAZEPAM 5 MG PO TABS
ORAL_TABLET | ORAL | Status: AC
  Administered 2021-07-13: 10 mg via ORAL
  Filled 2021-07-13: qty 2

## 2021-07-13 NOTE — Interval H&P Note (Signed)
History and Physical Interval Note:  07/13/2021 8:14 AM  Valerie Cole  has presented today for surgery, with the diagnosis of Left Ureteral Stone.  The various methods of treatment have been discussed with the patient and family. After consideration of risks, benefits and other options for treatment, the patient has consented to  Procedure(s): EXTRACORPOREAL SHOCK WAVE LITHOTRIPSY (ESWL) (Left) as a surgical intervention.  The patient's history has been reviewed, patient examined, no change in status, stable for surgery.  I have reviewed the patient's chart and labs.  Questions were answered to the patient's satisfaction.    RRR CTAB   Vanna Scotland

## 2021-07-13 NOTE — Progress Notes (Incomplete)
ESWL ORDER FORM  Expected date of procedure: 07/13/2021  Surgeon: Hollice Espy, MD  Post op standing: 2-4wk follow up w/KUB prior  Anticoagulation/Aspirin/NSAID standing order: Hold all 72 hours prior  Anesthesia standing order: MAC  VTE standing: SCD's  Dx: Left Ureteral Stone  Procedure: left Extracorporeal shock wave lithotripsy  CPT : 94801  Standing Order Set:   *NPO after mn, KUB  *NS 129m/hr, Keflex 502mPO, Benadryl 2561mO, Valium 4m19m, Zofran 4mg 8m   Medications if other than standing orders:   NONE

## 2021-07-13 NOTE — Discharge Instructions (Signed)
See [facility] discharge instructions in chart.  

## 2021-07-17 ENCOUNTER — Other Ambulatory Visit: Payer: Self-pay

## 2021-07-17 DIAGNOSIS — N201 Calculus of ureter: Secondary | ICD-10-CM

## 2021-08-03 ENCOUNTER — Ambulatory Visit: Payer: BC Managed Care – PPO | Admitting: Physician Assistant

## 2021-08-03 ENCOUNTER — Encounter: Payer: Self-pay | Admitting: Physician Assistant

## 2021-08-03 ENCOUNTER — Other Ambulatory Visit: Payer: Self-pay

## 2021-08-03 ENCOUNTER — Ambulatory Visit
Admission: RE | Admit: 2021-08-03 | Discharge: 2021-08-03 | Disposition: A | Discharge status: Home / Self Care | Payer: BC Managed Care – PPO | Source: Ambulatory Visit | Attending: Urology | Admitting: Urology

## 2021-08-03 ENCOUNTER — Ambulatory Visit
Admission: RE | Admit: 2021-08-03 | Discharge: 2021-08-03 | Disposition: A | Discharge status: Home / Self Care | Payer: BC Managed Care – PPO | Attending: Urology | Admitting: Urology

## 2021-08-03 VITALS — BP 115/78 | HR 111 | Ht 67.0 in | Wt 159.0 lb

## 2021-08-03 DIAGNOSIS — Z9851 Tubal ligation status: Secondary | ICD-10-CM | POA: Diagnosis not present

## 2021-08-03 DIAGNOSIS — N201 Calculus of ureter: Secondary | ICD-10-CM

## 2021-08-03 DIAGNOSIS — N200 Calculus of kidney: Secondary | ICD-10-CM

## 2021-08-03 DIAGNOSIS — Z87442 Personal history of urinary calculi: Secondary | ICD-10-CM | POA: Diagnosis not present

## 2021-08-03 NOTE — Progress Notes (Unsigned)
08/03/2021 2:30 PM   Valerie Cole 1970-08-24 [id number]  CC: Chief Complaint  Patient presents with   Nephrolithiasis   HPI: Valerie Cole is a 51 y.o. female with PMH recurrent nephrolithiasis who underwent ESWL with Dr. Erlene Quan on 07/13/2021 for management of a 9 mm distal left ureteral stone who presents today for postop follow-up.  Operative note describes fragmentation of the stone.  Today she reports she passed several fragments over the first 2 to 3 days following her ESWL, which she brings with her today for analysis.  Her flank pain has resolved.  She is interested in pursuing metabolic work-up for her recurrent stone disease.  She reports that her father had innumerable kidney stones.  She admits that she is under hydrated, however she does not like plain water and she gets sores in her mouth if she eats or drinks too much acidic food.  She had been drinking a lot of lemonade, but backed off of this due to the ulcers  KUB today with interval resolution of the distal left ureteral stone.  In-office UA today positive for 2+ blood; urine microscopy with many bacteria.   PMH: Past Medical History:  Diagnosis Date   Anxiety    Factor 5 Leiden mutation, heterozygous [facility])     Surgical History: Past Surgical History:  Procedure Laterality Date   CESAREAN SECTION     colapsed lung     EXTRACORPOREAL SHOCK WAVE LITHOTRIPSY Left 07/13/2021   Procedure: EXTRACORPOREAL SHOCK WAVE LITHOTRIPSY (ESWL);  Surgeon: Hollice Espy, MD;  Location: [facility] ORS;  Service: Urology;  Laterality: Left;   kidney stint     kidney stent   LITHOTRIPSY      Home Medications:  Allergies as of 08/03/2021       Reactions   Focalin Xr [dexmethylphenidate Hcl Er] Nausea Only   And broke out in sweat when taking on empty stomach.   Demerol Itching        Medication List        Accurate as of August 03, 2021  2:30 PM. If you have any questions, ask your nurse or doctor.           amphetamine-dextroamphetamine 30 MG 24 hr capsule Commonly known as: Adderall XR Take 1 capsule (30 mg total) by mouth daily.   amphetamine-dextroamphetamine 30 MG 24 hr capsule Commonly known as: Adderall XR Take 1 capsule (30 mg total) by mouth daily.   amphetamine-dextroamphetamine 30 MG 24 hr capsule Commonly known as: ADDERALL XR Take 1 capsule (30 mg total) by mouth daily.   cetirizine 10 MG tablet Commonly known as: ZYRTEC Take 10 mg by mouth daily.   clindamycin 300 MG capsule Commonly known as: CLEOCIN Take 300 mg by mouth 3 (three) times daily.   ondansetron 4 MG tablet Commonly known as: Zofran Take 1 tablet (4 mg total) by mouth every 8 (eight) hours as needed for nausea or vomiting.   OVER THE COUNTER MEDICATION HORMONE HARMONY   oxyCODONE-acetaminophen 5-325 MG tablet Commonly known as: Percocet Take 1-2 tablets by mouth every 4 (four) hours as needed for moderate pain or severe pain.   tamsulosin 0.4 MG Caps capsule Commonly known as: Flomax Take 1 capsule (0.4 mg total) by mouth daily.   Vitamin D 50 MCG (2000 UT) tablet Take 2,000 Units by mouth daily.        Allergies:  Allergies  Allergen Reactions   Focalin Xr [Dexmethylphenidate Hcl Er] Nausea Only    And  broke out in sweat when taking on empty stomach.   Demerol Itching    Family History: Family History  Problem Relation Age of Onset   Hypertension Mother    Breast cancer Mother    Arthritis Mother    Heart disease Mother    Hyperlipidemia Mother    Diabetes Mother    Arthritis Father    Diabetes Father    Hypertension Maternal Grandmother    Arthritis Maternal Grandmother    Hyperlipidemia Maternal Grandmother    Heart disease Maternal Grandmother    Heart disease Paternal Grandfather     Social History:   reports that she has never smoked. She has never used smokeless tobacco. She reports that she does not drink alcohol and does not use drugs.  Physical Exam: LMP  07/06/2021 (Approximate)   Constitutional:  Alert and oriented, no acute distress, nontoxic appearing HEENT: [place], AT Cardiovascular: No clubbing, cyanosis, or edema Respiratory: Normal respiratory effort, no increased work of breathing Skin: No rashes, bruises or suspicious lesions Neurologic: Grossly intact, no focal deficits, moving all 4 extremities Psychiatric: Normal mood and affect  Laboratory Data: Results for orders placed or performed in visit on 08/03/21  Microscopic Examination   Urine  Result Value Ref Range   WBC, UA 0-5 0 - 5 /hpf   RBC 0-2 0 - 2 /hpf   Epithelial Cells (non renal) 0-10 0 - 10 /hpf   Bacteria, UA Many (A) None seen/Few  Urinalysis, Complete  Result Value Ref Range   Specific Gravity, UA 1.020 1.005 - 1.030   pH, UA 6.0 5.0 - 7.5   Color, UA Yellow Yellow   Appearance Ur Clear Clear   Leukocytes,UA Negative Negative   Protein,UA Negative Negative/Trace   Glucose, UA Negative Negative   Ketones, UA Negative Negative   RBC, UA 2+ (A) Negative   Bilirubin, UA Negative Negative   Urobilinogen, Ur 0.2 0.2 - 1.0 mg/dL   Nitrite, UA Negative Negative   Microscopic Examination See below:    Pertinent Imaging: KUB, 08/03/2021: CLINICAL DATA:  S/P ESWL Left Ureteral Stone   EXAM: ABDOMEN - 1 VIEW   COMPARISON:  X-ray abdomen 07/13/2021, CT abdomen pelvis 06/27/2021   FINDINGS: Similar-appearing calcified stones again noted to overlie bilateral renal shadows. Tubal ligation clips overlie the pelvis. The bowel gas pattern is normal.   IMPRESSION: Similar-appearing bilateral nephrolithiasis.     Electronically Signed   By: Iven Finn M.D.   On: 08/04/2021 23:38  I personally reviewed the images referenced above and note interval resolution of the distal left ureteral stone.  Assessment & Plan:   1. Left ureteral stone Multiple fragments passed, pain resolved, no hematuria on UA today, and interval resolution of the stone on KUB.  We will  send fragments for analysis.  We discussed proceeding with Litholink metabolic work-up.  I explained that she will need to defer collecting her urine sample until at least 4 weeks from her most recent fragment passage. - Urinalysis, Complete - Calculi, with Photograph; Future   Return in about 6 weeks (around 09/14/2021) for Litholink results.  Debroah Loop, PA-C  [facility] [address], [place], [place] [address] [phone]

## 2021-08-03 NOTE — Patient Instructions (Signed)

## 2021-08-04 LAB — URINALYSIS, COMPLETE
Bilirubin, UA: NEGATIVE
Glucose, UA: NEGATIVE
Ketones, UA: NEGATIVE
Leukocytes,UA: NEGATIVE
Nitrite, UA: NEGATIVE
Protein,UA: NEGATIVE
Specific Gravity, UA: 1.02 (ref 1.005–1.030)
Urobilinogen, Ur: 0.2 mg/dL (ref 0.2–1.0)
pH, UA: 6 (ref 5.0–7.5)

## 2021-08-04 LAB — MICROSCOPIC EXAMINATION

## 2021-08-09 ENCOUNTER — Other Ambulatory Visit: Payer: Self-pay | Admitting: Nurse Practitioner

## 2021-08-09 DIAGNOSIS — F909 Attention-deficit hyperactivity disorder, unspecified type: Secondary | ICD-10-CM

## 2021-08-09 LAB — CALCULI, WITH PHOTOGRAPH (CLINICAL LAB)
Calcium Oxalate Dihydrate: 30 %
Calcium Oxalate Monohydrate: 65 %
Hydroxyapatite: 5 %
Weight Calculi: 24 mg

## 2021-08-09 MED ORDER — AMPHETAMINE-DEXTROAMPHET ER 30 MG PO CP24: 30.0000 mg | ORAL_CAPSULE | Freq: Every day | ORAL | 0 refills | Status: DC

## 2021-08-27 ENCOUNTER — Telehealth: Payer: BC Managed Care – PPO | Admitting: Family

## 2021-08-27 DIAGNOSIS — B3731 Acute candidiasis of vulva and vagina: Secondary | ICD-10-CM | POA: Diagnosis not present

## 2021-08-27 MED ORDER — FLUCONAZOLE 150 MG PO TABS: 150.0000 mg | ORAL_TABLET | ORAL | 0 refills | Status: DC | PRN

## 2021-08-27 NOTE — Progress Notes (Signed)

## 2021-09-04 ENCOUNTER — Other Ambulatory Visit: Payer: Self-pay | Admitting: Physician Assistant

## 2021-09-04 ENCOUNTER — Other Ambulatory Visit: Payer: BC Managed Care – PPO

## 2021-09-04 DIAGNOSIS — N200 Calculus of kidney: Secondary | ICD-10-CM | POA: Diagnosis not present

## 2021-09-06 LAB — LITHOLINK SERUM PANEL
CO2: 23 mmol/L (ref 20–29)
Calcium: 9.7 mg/dL (ref 8.7–10.2)
Chloride: 104 mmol/L (ref 96–106)
Creatinine, Ser: 0.77 mg/dL (ref 0.57–1.00)
Magnesium: 2 mg/dL (ref 1.6–2.3)
Phosphorus: 3.2 mg/dL (ref 3.0–4.3)
Potassium: 4.5 mmol/L (ref 3.5–5.2)
Sodium: 142 mmol/L (ref 134–144)
Uric Acid: 3.3 mg/dL (ref 2.6–6.2)
eGFR: 95 mL/min/{1.73_m2} (ref >59)

## 2021-09-08 LAB — LITHOLINK 24HR URINE PANEL
Ammonium, Urine: 28 mmol/24 hr (ref 15–60)
Calcium Oxalate Saturation: 5.3 — ABNORMAL LOW (ref 6.00–10.00)
Calcium Phosphate Saturation: 2.17 — ABNORMAL HIGH (ref 0.50–2.00)
Calcium, Urine: 256 mg/24 hr — ABNORMAL HIGH (ref <200)
Calcium/Creatinine Ratio: 212 mg/g creat (ref 51–262)
Calcium/Kg Body Weight: 3.6 mg/24 hr/kg (ref <4.0)
Chloride, Urine: 155 mmol/24 hr (ref 70–250)
Citrate, Urine: 602 mg/24 hr (ref >550)
Creatinine, Urine: 1211 mg/24 hr
Creatinine/Kg Body Weight: 17.2 mg/24 hr/kg (ref 8.7–20.3)
Cystine, Urine, Qualitative: NEGATIVE
Magnesium, Urine: 113 mg/24 hr (ref 30–120)
Oxalate, Urine: 26 mg/24 hr (ref 20–40)
Phosphorus, Urine: 1042 mg/24 hr (ref 600–1200)
Potassium, Urine: 56 mmol/24 hr (ref 20–100)
Protein Catabolic Rate: 1.2 g/kg/24 hr (ref 0.8–1.4)
Sodium, Urine: 168 mmol/24 hr — ABNORMAL HIGH (ref 50–150)
Sulfate, Urine: 33 meq/24 hr (ref 20–80)
Urea Nitrogen, Urine: 11.61 g/24 hr (ref 6.00–14.00)
Uric Acid Saturation: 0.43 (ref <1.00)
Uric Acid, Urine: 793 mg/24 hr — ABNORMAL HIGH (ref <750)
Urine Volume (Preserved): 1840 mL/24 hr (ref 500–4000)
pH, 24 hr, Urine: 6.351 — ABNORMAL HIGH (ref 5.800–6.200)

## 2021-09-14 ENCOUNTER — Ambulatory Visit: Payer: BC Managed Care – PPO | Admitting: Physician Assistant

## 2021-09-14 ENCOUNTER — Other Ambulatory Visit: Payer: Self-pay | Admitting: Physician Assistant

## 2021-09-20 ENCOUNTER — Encounter: Payer: Self-pay | Admitting: Physician Assistant

## 2021-09-20 ENCOUNTER — Ambulatory Visit: Payer: BC Managed Care – PPO | Admitting: Physician Assistant

## 2021-09-20 VITALS — BP 112/75 | HR 93 | Ht 67.0 in | Wt 159.0 lb

## 2021-09-20 DIAGNOSIS — R82994 Hypercalciuria: Secondary | ICD-10-CM

## 2021-09-20 DIAGNOSIS — R34 Anuria and oliguria: Secondary | ICD-10-CM | POA: Diagnosis not present

## 2021-09-20 NOTE — Patient Instructions (Signed)
Increase fluid intake to 80-100oz daily. Reduce added sodium, try to limit overall sodium intake to less than 2300mg /day. Maintain moderate daily calcium intake, 1000-1200mg  daily. We'll check your parathyroid levels today and I'll call you with your results; we may start a medication or supplement based on your results.

## 2021-09-20 NOTE — Progress Notes (Signed)
09/20/2021 10:22 AM   Valerie Cole 03-08-1970 [id number]  CC: Chief Complaint  Patient presents with   Nephrolithiasis   HPI: Valerie Cole is a 51 y.o. female with PMH recurrent stone disease with a strong family history of nephrolithiasis who presents today for Litholink results.   Today she reports she knows that she is under hydrated and she enjoys adding salt to her foods.  She has been trying to increase her hydration and avoid added salt.  She has also been eating fewer frozen meals and cooking food herself at home.  She has been using My[employer]itnessPal in an attempt to lose weight and has noticed that she has been hitting the daily calcium goal it recommended for her; 1300mg  daily.  Litholink result reveals inadequate urine volume, 1.84 L, as well as hypercalciuria, 256 mg.  Most recent stone analysis with 65% calcium oxalate monohydrate, 30% calcium oxalate dihydrate, and 5% hydroxyapatite.  PMH: Past Medical History:  Diagnosis Date   Anxiety    Factor 5 Leiden mutation, heterozygous [facility])     Surgical History: Past Surgical History:  Procedure Laterality Date   CESAREAN SECTION     colapsed lung     EXTRACORPOREAL SHOCK WAVE LITHOTRIPSY Left 07/13/2021   Procedure: EXTRACORPOREAL SHOCK WAVE LITHOTRIPSY (ESWL);  Surgeon: 07/15/2021, MD;  Location: [facility] ORS;  Service: Urology;  Laterality: Left;   kidney stint     kidney stent   LITHOTRIPSY      Home Medications:  Allergies as of 09/20/2021       Reactions   Focalin Xr [dexmethylphenidate Hcl Er] Nausea Only   And broke out in sweat when taking on empty stomach.   Demerol Itching        Medication List        Accurate as of September 20, 2021 10:22 AM. If you have any questions, ask your nurse or doctor.          STOP taking these medications    clindamycin 300 MG capsule Commonly known as: CLEOCIN Stopped by: September 22, 2021, PA-C   fluconazole 150 MG tablet Commonly known as:  DIFLUCAN Stopped by: Carman Ching, PA-C   ondansetron 4 MG tablet Commonly known as: Zofran Stopped by: Carman Ching, PA-C   oxyCODONE-acetaminophen 5-325 MG tablet Commonly known as: Percocet Stopped by: Carman Ching, PA-C       TAKE these medications    amphetamine-dextroamphetamine 30 MG 24 hr capsule Commonly known as: Adderall XR Take 1 capsule (30 mg total) by mouth daily. Will need office visit for further refills   amphetamine-dextroamphetamine 30 MG 24 hr capsule Commonly known as: Adderall XR Take 1 capsule (30 mg total) by mouth daily.   amphetamine-dextroamphetamine 30 MG 24 hr capsule Commonly known as: ADDERALL XR Take 1 capsule (30 mg total) by mouth daily.   cetirizine 10 MG tablet Commonly known as: ZYRTEC Take 10 mg by mouth daily.   OVER THE COUNTER MEDICATION HORMONE HARMONY   Vitamin D 50 MCG (2000 UT) tablet Take 2,000 Units by mouth daily.        Allergies:  Allergies  Allergen Reactions   Focalin Xr [Dexmethylphenidate Hcl Er] Nausea Only    And broke out in sweat when taking on empty stomach.   Demerol Itching    Family History: Family History  Problem Relation Age of Onset   Hypertension Mother    Breast cancer Mother    Arthritis Mother    Heart disease Mother  Hyperlipidemia Mother    Diabetes Mother    Arthritis Father    Diabetes Father    Hypertension Maternal Grandmother    Arthritis Maternal Grandmother    Hyperlipidemia Maternal Grandmother    Heart disease Maternal Grandmother    Heart disease Paternal Grandfather     Social History:   reports that she has never smoked. She has never used smokeless tobacco. She reports that she does not drink alcohol and does not use drugs.  Physical Exam: BP 112/75   Pulse 93   Ht 5\' 7"  (1.702 m)   Wt 159 lb (72.1 kg)   BMI 24.90 kg/m   Constitutional:  Alert and oriented, no acute distress, nontoxic appearing HEENT: [place], AT Cardiovascular:  No clubbing, cyanosis, or edema Respiratory: Normal respiratory effort, no increased work of breathing Skin: No rashes, bruises or suspicious lesions Neurologic: Grossly intact, no focal deficits, moving all 4 extremities Psychiatric: Normal mood and affect  Laboratory Data: Results for orders placed or performed in visit on 09/04/21  Litholink 24Hr Urine Panel  Result Value Ref Range   Cystine, Urine, Qualitative Neg Negative   Urine Volume (Preserved) 1,840 500 - 4,000 mL/24 hr   Calcium Oxalate Saturation 5.30 (L) 6.00 - 10.00   Calcium, Urine 256 (H) <200 mg/24 hr   Oxalate, Urine 26 20 - 40 mg/24 hr   Citrate, Urine 602 >550 mg/24 hr   Calcium Phosphate Saturation 2.17 (H) 0.50 - 2.00   pH, 24 hr, Urine 6.351 (H) 5.800 - 6.200   Uric Acid Saturation 0.43 <1.00   Uric Acid, Urine 793 (H) <750 mg/24 hr   Sodium, Urine 168 (H) 50 - 150 mmol/24 hr   Potassium, Urine 56 20 - 100 mmol/24 hr   Magnesium, Urine 113 30 - 120 mg/24 hr   Phosphorus, Urine 1,042 600 - 1,200 mg/24 hr   Ammonium, Urine 28 15 - 60 mmol/24 hr   Chloride, Urine 155 70 - 250 mmol/24 hr   Sulfate, Urine 33 20 - 80 meq/24 hr   Urea Nitrogen, Urine 11.61 6.00 - 14.00 g/24 hr   Protein Catabolic Rate 1.2 0.8 - 1.4 g/kg/24 hr   Creatinine, Urine 1,211 Not Applic. mg/24 hr   Creatinine/Kg Body Weight 17.2 8.7 - 20.3 mg/24 hr/kg   Calcium/Kg Body Weight 3.6 <4.0 mg/24 hr/kg   Calcium/Creatinine Ratio 212 51 - 262 mg/g creat   Comment Note    Assessment & Plan:   1. Hypercalciuria We will check a PTH and calcium today, though serum calcium on Litholink was WNL.  Based on results, may consider starting thiazides versus potassiums citrate.  We discussed reducing added sodium and maintaining moderate dietary calcium (1000-1200mg  daily) as well. - PTH, Intact and Calcium  2. Decreased urine volume Increase hydration, recommend 80-100oz of fluid daily.  Return for Will call with results.  11/05/21,  PA-C  [facility] [address], [address] [place], [place] [phone]

## 2021-09-21 LAB — PTH, INTACT AND CALCIUM
Calcium: 9.6 mg/dL (ref 8.7–10.2)
PTH: 21 pg/mL (ref 15–65)

## 2021-11-05 ENCOUNTER — Other Ambulatory Visit: Payer: Self-pay | Admitting: Nurse Practitioner

## 2021-11-05 DIAGNOSIS — F909 Attention-deficit hyperactivity disorder, unspecified type: Secondary | ICD-10-CM

## 2021-11-06 MED ORDER — AMPHETAMINE-DEXTROAMPHET ER 30 MG PO CP24: 30.0000 mg | ORAL_CAPSULE | Freq: Every day | ORAL | 0 refills | Status: DC

## 2021-11-06 NOTE — Telephone Encounter (Signed)
Patient has an appointment scheduled for Friday 9.15.23 She takes her last pill on Tuesday 9.12.23  [id number] - [place], [place] - [address] Phone:  [phone]  Fax:  [phone]

## 2021-11-10 ENCOUNTER — Ambulatory Visit: Payer: BC Managed Care – PPO | Admitting: Nurse Practitioner

## 2021-11-10 ENCOUNTER — Encounter: Payer: Self-pay | Admitting: Nurse Practitioner

## 2021-11-10 VITALS — BP 104/70 | HR 88 | Temp 96.4°F | Resp 12 | Ht 67.0 in | Wt 164.1 lb

## 2021-11-10 DIAGNOSIS — E559 Vitamin D deficiency, unspecified: Secondary | ICD-10-CM

## 2021-11-10 DIAGNOSIS — Z0000 Encounter for general adult medical examination without abnormal findings: Secondary | ICD-10-CM | POA: Diagnosis not present

## 2021-11-10 DIAGNOSIS — E663 Overweight: Secondary | ICD-10-CM

## 2021-11-10 DIAGNOSIS — L290 Pruritus ani: Secondary | ICD-10-CM

## 2021-11-10 DIAGNOSIS — F909 Attention-deficit hyperactivity disorder, unspecified type: Secondary | ICD-10-CM | POA: Diagnosis not present

## 2021-11-10 DIAGNOSIS — Z1231 Encounter for screening mammogram for malignant neoplasm of breast: Secondary | ICD-10-CM

## 2021-11-10 DIAGNOSIS — Z1211 Encounter for screening for malignant neoplasm of colon: Secondary | ICD-10-CM

## 2021-11-10 LAB — LIPID PANEL
Cholesterol: 147 mg/dL (ref 0–200)
HDL: 57.2 mg/dL (ref >39.00)
LDL Cholesterol: 78 mg/dL (ref 0–99)
NonHDL: 89.71
Total CHOL/HDL Ratio: 3
Triglycerides: 57 mg/dL (ref 0.0–149.0)
VLDL: 11.4 mg/dL (ref 0.0–40.0)

## 2021-11-10 LAB — COMPREHENSIVE METABOLIC PANEL
ALT: 11 U/L (ref 0–35)
AST: 15 U/L (ref 0–37)
Albumin: 4 g/dL (ref 3.5–5.2)
Alkaline Phosphatase: 59 U/L (ref 39–117)
BUN: 17 mg/dL (ref 6–23)
CO2: 28 mEq/L (ref 19–32)
Calcium: 9.1 mg/dL (ref 8.4–10.5)
Chloride: 105 mEq/L (ref 96–112)
Creatinine, Ser: 0.83 mg/dL (ref 0.40–1.20)
GFR: 82.99 mL/min (ref >60.00)
Glucose, Bld: 82 mg/dL (ref 70–99)
Potassium: 4.3 mEq/L (ref 3.5–5.1)
Sodium: 139 mEq/L (ref 135–145)
Total Bilirubin: 0.7 mg/dL (ref 0.2–1.2)
Total Protein: 6.8 g/dL (ref 6.0–8.3)

## 2021-11-10 LAB — CBC
HCT: 40.6 % (ref 36.0–46.0)
Hemoglobin: 13.3 g/dL (ref 12.0–15.0)
MCHC: 32.8 g/dL (ref 30.0–36.0)
MCV: 86.7 fl (ref 78.0–100.0)
Platelets: 237 10*3/uL (ref 150.0–400.0)
RBC: 4.69 Mil/uL (ref 3.87–5.11)
RDW: 13.4 % (ref 11.5–15.5)
WBC: 7.2 10*3/uL (ref 4.0–10.5)

## 2021-11-10 LAB — VITAMIN D 25 HYDROXY (VIT D DEFICIENCY, FRACTURES): VITD: 38.64 ng/mL (ref 30.00–100.00)

## 2021-11-10 LAB — TSH: TSH: 0.87 u[IU]/mL (ref 0.35–5.50)

## 2021-11-10 LAB — HEMOGLOBIN A1C: Hgb A1c MFr Bld: 5.7 % (ref 4.6–6.5)

## 2021-11-10 MED ORDER — HYDROCORTISONE (PERIANAL) 2.5 % EX CREA: 1.0000 | TOPICAL_CREAM | Freq: Two times a day (BID) | CUTANEOUS | 0 refills | Status: DC

## 2021-11-10 NOTE — Assessment & Plan Note (Signed)
Remnants of a hemorrhoid.  Patient has been using over-the-counter witch hazel, moist stylette's, Preparation H without relief.  We will send in hydrocortisone 2.5% she can use twice a day for 1 week.  Continue using witch hazel and moisten wipes.  Follow-up if no improvement.

## 2021-11-10 NOTE — Progress Notes (Signed)
Established Patient Office Visit  Subjective   Patient ID: Valerie Cole, female    DOB: 1970/12/07  Age: 51 y.o. MRN: [id number]  Chief Complaint  Patient presents with   Annual Exam    Pap was done 12/2020    HPI  ADHD: still taking the adderall per patient report tolerating medication well.  She denies palpitations, insomnia, decreased appetite.  Vitamin D: states that she is taking over the counter vitamin d. Thinks that she is taking 1500-2000    for complete physical and follow up of chronic conditions.  Immunizations: -Tetanus: Unsure is up-to-date deferred today -Influenza: refused -Covid-19: refused -Shingles: too young -Pneumonia: too young  -HPV: aged out  Diet: Fair diet. 2 meals a day and some snakcing. Water with flavoring and decaff coffee twice a week Exercise:1 hour of walking once a day. 7 days a week   Eye exam: Completes annually. Glasses for driving. In search of a new clinician. Taking ocuvite daily, sometimes  Dental exam: Completes semi-annually   Pap Smear: Completed in 01/25/2021 Mammogram: Needs updating at normal breast center.  Colonoscopy: Never referral sent last year patient did not call them back.  We will refer this year Lung Cancer Screening: NA Dexa: Too young  Sleep:  states she will go to bed around 10-12 and gets up around 5am (bladder wakes her up) sometimes she will go back to sleep. Feels rested      Review of Systems  Constitutional:  Negative for chills and fever.  Respiratory:  Negative for shortness of breath.   Cardiovascular:  Negative for chest pain and palpitations.  Gastrointestinal:  Negative for abdominal pain, constipation, diarrhea, nausea and vomiting.  Neurological:  Negative for headaches.  Psychiatric/Behavioral:  Negative for hallucinations and suicidal ideas. The patient does not have insomnia.       Objective:     BP 104/70   Pulse 88   Temp (!) 96.4 F (35.8 C)   Resp 12   Ht 5\' 7"   (1.702 m)   Wt 164 lb 2 oz (74.4 kg)   LMP 11/09/2021   SpO2 98%   BMI 25.71 kg/m  BP Readings from Last 3 Encounters:  11/10/21 104/70  09/20/21 112/75  08/03/21 115/78   Wt Readings from Last 3 Encounters:  11/10/21 164 lb 2 oz (74.4 kg)  09/20/21 159 lb (72.1 kg)  08/03/21 159 lb (72.1 kg)      Physical Exam Vitals and nursing note reviewed. Exam conducted with a chaperone present [facility], RMA).  Constitutional:      Appearance: Normal appearance.  HENT:     Right Ear: Tympanic membrane, ear canal and external ear normal.     Left Ear: Tympanic membrane, ear canal and external ear normal.     Mouth/Throat:     Mouth: Mucous membranes are moist.     Pharynx: Oropharynx is clear.  Eyes:     Extraocular Movements: Extraocular movements intact.     Pupils: Pupils are equal, round, and reactive to light.  Cardiovascular:     Rate and Rhythm: Normal rate and regular rhythm.     Pulses: Normal pulses.     Heart sounds: Normal heart sounds.  Pulmonary:     Effort: Pulmonary effort is normal.     Breath sounds: Normal breath sounds.  Abdominal:     General: Bowel sounds are normal. There is no distension.     Palpations: There is no mass.     Tenderness:  There is no abdominal tenderness.     Hernia: No hernia is present.  Genitourinary:   Musculoskeletal:     Right lower leg: No edema.     Left lower leg: No edema.  Lymphadenopathy:     Cervical: No cervical adenopathy.  Skin:    General: Skin is warm.  Neurological:     General: No focal deficit present.     Mental Status: She is alert.     Deep Tendon Reflexes:     Reflex Scores:      Bicep reflexes are 2+ on the right side and 2+ on the left side.      Patellar reflexes are 2+ on the right side and 2+ on the left side.    Comments: Bilateral upper and lower extremity strength 5/5  Psychiatric:        Mood and Affect: Mood normal.        Behavior: Behavior normal.        Thought Content: Thought  content normal.        Judgment: Judgment normal.      No results found for any visits on 11/10/21.    The 10-year ASCVD risk score (Arnett DK, et al., 2019) is: 0.5%    Assessment & Plan:   Problem List Items Addressed This Visit       Digestive   Anal itching    Remnants of a hemorrhoid.  Patient has been using over-the-counter witch hazel, moist stylette's, Preparation H without relief.  We will send in hydrocortisone 2.5% she can use twice a day for 1 week.  Continue using witch hazel and moisten wipes.  Follow-up if no improvement.      Relevant Medications   hydrocortisone (PROCTO-MED HC) 2.5 % rectal cream     Other   Adult ADHD    Continue taking Adderall as prescribed.  Next office visit we will update controlled substance agreement pending urinary drug screen.      Vitamin D deficiency    Pending labs today.      Relevant Orders   VITAMIN D 25 Hydroxy (Vit-D Deficiency, Fractures)   Preventative health care - Primary    Discussed age-appropriate immunizations and screening exams today.      Relevant Orders   CBC   Comprehensive metabolic panel   Hemoglobin A1c   Lipid panel   TSH   Other Visit Diagnoses     Overweight       Relevant Orders   Hemoglobin A1c   Lipid panel   TSH   Screening for colon cancer       Relevant Orders   Ambulatory referral to Gastroenterology   Encounter for screening mammogram for malignant neoplasm of breast       Relevant Orders   MM Digital Screening       Return in about 6 months (around 05/11/2022) for Medicatoin recheck .    Audria Nine, NP

## 2021-11-10 NOTE — Patient Instructions (Signed)
Nice to see you today I want to see you in 6 months for a check up , sooner if you need me I sent in some cream for your bottom you can use it for a week

## 2021-11-10 NOTE — Assessment & Plan Note (Signed)
Discussed age-appropriate immunizations and screening exams today. 

## 2021-11-10 NOTE — Assessment & Plan Note (Signed)
Continue taking Adderall as prescribed.  Next office visit we will update controlled substance agreement pending urinary drug screen.

## 2021-11-10 NOTE — Assessment & Plan Note (Signed)
Pending labs today. 

## 2021-11-17 ENCOUNTER — Ambulatory Visit
Admission: RE | Admit: 2021-11-17 | Discharge: 2021-11-17 | Disposition: A | Discharge status: Home / Self Care | Payer: BC Managed Care – PPO | Source: Ambulatory Visit | Attending: Family | Admitting: Family

## 2021-11-17 DIAGNOSIS — Z1231 Encounter for screening mammogram for malignant neoplasm of breast: Secondary | ICD-10-CM | POA: Insufficient documentation

## 2021-11-23 ENCOUNTER — Other Ambulatory Visit: Payer: Self-pay | Admitting: Nurse Practitioner

## 2021-11-23 ENCOUNTER — Telehealth: Payer: Self-pay | Admitting: Nurse Practitioner

## 2021-11-23 DIAGNOSIS — F909 Attention-deficit hyperactivity disorder, unspecified type: Secondary | ICD-10-CM

## 2021-11-23 MED ORDER — AMPHETAMINE-DEXTROAMPHET ER 30 MG PO CP24: 30.0000 mg | ORAL_CAPSULE | Freq: Every day | ORAL | 0 refills | Status: DC

## 2021-11-23 NOTE — Telephone Encounter (Signed)
Pt stated she's leaving to go to [place], [place] on 11/30/21 & is leaving for 2 weeks. Pt was wondering since her prescription is suppose to be refilled around 12/06/21, could she get the meds filled sooner before she leaves on 10/5? Call back # is [phone]

## 2021-11-23 NOTE — Telephone Encounter (Signed)
[facility] about 7 day early refill and they will not be able to know if they can override with traveling as the reason for the RX until we send it in and they run it through.

## 2021-11-23 NOTE — Telephone Encounter (Signed)
Sent script in and called and ok for early fill if insurance allows

## 2021-11-24 NOTE — Telephone Encounter (Signed)
Patient advised.

## 2021-12-29 ENCOUNTER — Other Ambulatory Visit: Payer: Self-pay | Admitting: Nurse Practitioner

## 2021-12-29 DIAGNOSIS — F909 Attention-deficit hyperactivity disorder, unspecified type: Secondary | ICD-10-CM

## 2022-01-01 MED ORDER — AMPHETAMINE-DEXTROAMPHET ER 30 MG PO CP24: 30.0000 mg | ORAL_CAPSULE | Freq: Every day | ORAL | 0 refills | Status: DC

## 2022-01-31 ENCOUNTER — Other Ambulatory Visit: Payer: Self-pay | Admitting: Nurse Practitioner

## 2022-01-31 DIAGNOSIS — F909 Attention-deficit hyperactivity disorder, unspecified type: Secondary | ICD-10-CM

## 2022-01-31 MED ORDER — AMPHETAMINE-DEXTROAMPHET ER 30 MG PO CP24: 30.0000 mg | ORAL_CAPSULE | Freq: Every day | ORAL | 0 refills | Status: DC

## 2022-01-31 NOTE — Telephone Encounter (Signed)
Refill request for amphetamine-dextroamphetamine (ADDERALL XR) 30 MG 24 hr capsule   LOV - 11/10/21 Next OV - 05/11/22 Last refill - 01/01/22 #30/0

## 2022-02-07 ENCOUNTER — Ambulatory Visit (INDEPENDENT_AMBULATORY_CARE_PROVIDER_SITE_OTHER): Payer: BC Managed Care – PPO | Admitting: Certified Nurse Midwife

## 2022-02-07 ENCOUNTER — Encounter: Payer: Self-pay | Admitting: Certified Nurse Midwife

## 2022-02-07 VITALS — BP 116/79 | HR 98 | Resp 16 | Ht 67.0 in | Wt 151.7 lb

## 2022-02-07 DIAGNOSIS — Z01419 Encounter for gynecological examination (general) (routine) without abnormal findings: Secondary | ICD-10-CM | POA: Diagnosis not present

## 2022-02-07 DIAGNOSIS — Z1231 Encounter for screening mammogram for malignant neoplasm of breast: Secondary | ICD-10-CM

## 2022-02-07 MED ORDER — NITROFURANTOIN MONOHYD MACRO 100 MG PO CAPS: 100.0000 mg | ORAL_CAPSULE | Freq: Once | ORAL | 2 refills | Status: DC | PRN

## 2022-02-07 NOTE — Progress Notes (Signed)
GYNECOLOGY ANNUAL PREVENTATIVE CARE ENCOUNTER NOTE  History:     Valerie Cole is a 51 y.o. [phone] female here for a routine annual gynecologic exam.  Current complaints: she continues to have perimenopausal symptoms of vaginal dryness, dyspareunia , and recently recurrent Uti especial related to intercourse..   Denies abnormal vaginal bleeding, discharge, pelvic pain, problems with intercourse or other gynecologic concerns.     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 Patient is perimenopausal. Contraception: tubal ligation Last Pap: 01/25/2021. Results were: normal with negative HPV Last mammogram: 10/2021. Results were: normal  Obstetric History OB History  Gravida Para Term Preterm AB Living  3 3 2 1   3   SAB IAB Ectopic Multiple Live Births          1    # Outcome Date GA Lbr Len/2nd Weight Sex Delivery Anes PTL Lv  3 Term 03/06/11 [redacted]w[redacted]d  6 lb 8.8 oz (2.97 kg) F CS-LTranv Spinal  LIV     Birth Comments: no dysmorphic features, Voided in OR  2 Preterm           1 Term             Past Medical History:  Diagnosis Date   Anxiety    Factor 5 Leiden mutation, heterozygous [facility])     Past Surgical History:  Procedure Laterality Date   CESAREAN SECTION     colapsed lung     EXTRACORPOREAL SHOCK WAVE LITHOTRIPSY Left 07/13/2021   Procedure: EXTRACORPOREAL SHOCK WAVE LITHOTRIPSY (ESWL);  Surgeon: Hollice Espy, MD;  Location: [facility] ORS;  Service: Urology;  Laterality: Left;   kidney stint     kidney stent   LITHOTRIPSY      Current Outpatient Medications on File Prior to Visit  Medication Sig Dispense Refill   amphetamine-dextroamphetamine (ADDERALL XR) 30 MG 24 hr capsule Take 1 capsule (30 mg total) by mouth daily. 30 capsule 0   amphetamine-dextroamphetamine (ADDERALL XR) 30  MG 24 hr capsule Take 1 capsule (30 mg total) by mouth daily. 30 capsule 0   amphetamine-dextroamphetamine (ADDERALL XR) 30 MG 24 hr capsule Take 1 capsule (30 mg total) by mouth daily. 30 capsule 0   cetirizine (ZYRTEC) 10 MG tablet Take 10 mg by mouth daily.     Cholecalciferol (VITAMIN D) 50 MCG (2000 UT) tablet Take 2,000 Units by mouth daily.     hydrocortisone (PROCTO-MED HC) 2.5 % rectal cream Place 1 Application rectally 2 (two) times daily. For a week 30 g 0   OVER THE COUNTER MEDICATION HORMONE HARMONY     Current Facility-Administered Medications on File Prior to Visit  Medication Dose Route Frequency Provider Last Rate Last Admin   cephALEXin (KEFLEX) capsule 500 mg  500 mg Oral Once Hollice Espy, MD       ondansetron [facility]) injection 4 mg  4 mg Intravenous Once Hollice Espy, MD        Allergies  Allergen Reactions   Focalin Xr [Dexmethylphenidate Hcl Er] Nausea Only    And broke out in sweat when taking on empty stomach.   Demerol Itching    Social History:  reports that she has never smoked. She has never used smokeless tobacco. She reports that she does not drink alcohol and does not use drugs.  Family History  Problem Relation Age of Onset   Hypertension Mother    Breast cancer Mother 57   Arthritis Mother    Heart disease Mother    Hyperlipidemia Mother    Diabetes Mother    Arthritis Father    Diabetes Father    Hypertension Maternal Grandmother    Arthritis Maternal Grandmother    Hyperlipidemia Maternal Grandmother    Heart disease Maternal Grandmother    Heart disease Paternal Grandfather     The following portions of the patient's history were reviewed and updated as appropriate: allergies, current medications, past family history, past medical history, past social history, past surgical history and problem list.  Review of Systems Pertinent items noted in HPI and remainder of comprehensive ROS otherwise negative.  Physical Exam:  There were  no vitals taken for this visit. CONSTITUTIONAL: Well-developed, well-nourished female in no acute distress.  HENT:  Normocephalic, atraumatic, External right and left ear normal. Oropharynx is clear and moist EYES: Conjunctivae and EOM are normal. Pupils are equal, round, and reactive to light. No scleral icterus.  NECK: Normal range of motion, supple, no masses.  Normal thyroid.  SKIN: Skin is warm and dry. No rash noted. Not diaphoretic. No erythema. No pallor. MUSCULOSKELETAL: Normal range of motion. No tenderness.  No cyanosis, clubbing, or edema.  2+ distal pulses. NEUROLOGIC: Alert and oriented to person, place, and time. Normal reflexes, muscle tone coordination.  PSYCHIATRIC: Normal mood and affect. Normal behavior. Normal judgment and thought content. CARDIOVASCULAR: Normal heart rate noted, regular rhythm RESPIRATORY: Clear to auscultation bilaterally. Effort and breath sounds normal, no problems with respiration noted. BREASTS: Symmetric in size. No masses, tenderness, skin changes, nipple drainage, or lymphadenopathy bilaterally.  ABDOMEN: Soft, no distention noted.  No tenderness, rebound or guarding.  PELVIC: Normal appearing external genitalia and urethral meatus; normal appearing vaginal mucosa and cervix.  No abnormal discharge noted.  Pap smear not due,. Pt on her period blood noted.  Normal uterine size, no other palpable masses, no uterine or adnexal tenderness.  .   Assessment and Plan:    1. Women's annual routine gynecological examination  Pap: not due Mammogram : ordered Labs: none Refills: macrobid postcoital prophylaxis  Referral: none  Colonoscopy: declines at this time  TDAP: pt declines  Routine preventative health maintenance measures emphasized. Please refer to After Visit Summary for other counseling recommendations.      Doreene Burke, CNM [facility]  [facility],  [facility]

## 2022-02-07 NOTE — Patient Instructions (Signed)
Preventive Care 40-49 Years Old, Female Preventive care refers to lifestyle choices and visits with your health care provider that can promote health and wellness. Preventive care visits are also called wellness exams. What can I expect for my preventive care visit? Counseling Your health care provider may ask you questions about your: Medical history, including: Past medical problems. Family medical history. Pregnancy history. Current health, including: Menstrual cycle. Method of birth control. Emotional well-being. Home life and relationship well-being. Sexual activity and sexual health. Lifestyle, including: Alcohol, nicotine or tobacco, and drug use. Access to firearms. Diet, exercise, and sleep habits. Work and work environment. Sunscreen use. Safety issues such as seatbelt and bike helmet use. Physical exam Your health care provider will check your: Height and weight. These may be used to calculate your BMI (body mass index). BMI is a measurement that tells if you are at a healthy weight. Waist circumference. This measures the distance around your waistline. This measurement also tells if you are at a healthy weight and may help predict your risk of certain diseases, such as type 2 diabetes and high blood pressure. Heart rate and blood pressure. Body temperature. Skin for abnormal spots. What immunizations do I need?  Vaccines are usually given at various ages, according to a schedule. Your health care provider will recommend vaccines for you based on your age, medical history, and lifestyle or other factors, such as travel or where you work. What tests do I need? Screening Your health care provider may recommend screening tests for certain conditions. This may include: Lipid and cholesterol levels. Diabetes screening. This is done by checking your blood sugar (glucose) after you have not eaten for a while (fasting). Pelvic exam and Pap test. Hepatitis B test. Hepatitis C  test. HIV (human immunodeficiency virus) test. STI (sexually transmitted infection) testing, if you are at risk. Lung cancer screening. Colorectal cancer screening. Mammogram. Talk with your health care provider about when you should start having regular mammograms. This may depend on whether you have a family history of breast cancer. BRCA-related cancer screening. This may be done if you have a family history of breast, ovarian, tubal, or peritoneal cancers. Bone density scan. This is done to screen for osteoporosis. Talk with your health care provider about your test results, treatment options, and if necessary, the need for more tests. Follow these instructions at home: Eating and drinking  Eat a diet that includes fresh fruits and vegetables, whole grains, lean protein, and low-fat dairy products. Take vitamin and mineral supplements as recommended by your health care provider. Do not drink alcohol if: Your health care provider tells you not to drink. You are pregnant, may be pregnant, or are planning to become pregnant. If you drink alcohol: Limit how much you have to 0-1 drink a day. Know how much alcohol is in your drink. In the [place] one drink equals one 12 oz bottle of beer (355 mL), one 5 oz glass of wine (148 mL), or one 1 oz glass of hard liquor (44 mL). Lifestyle Brush your teeth every morning and night with fluoride toothpaste. Floss one time each day. Exercise for at least 30 minutes 5 or more days each week. Do not use any products that contain nicotine or tobacco. These products include cigarettes, chewing tobacco, and vaping devices, such as e-cigarettes. If you need help quitting, ask your health care provider. Do not use drugs. If you are sexually active, practice safe sex. Use a condom or other form of protection to   prevent STIs. If you do not wish to become pregnant, use a form of birth control. If you plan to become pregnant, see your health care provider for a  prepregnancy visit. Take aspirin only as told by your health care provider. Make sure that you understand how much to take and what form to take. Work with your health care provider to find out whether it is safe and beneficial for you to take aspirin daily. Find healthy ways to manage stress, such as: Meditation, yoga, or listening to music. Journaling. Talking to a trusted person. Spending time with friends and family. Minimize exposure to UV radiation to reduce your risk of skin cancer. Safety Always wear your seat belt while driving or riding in a vehicle. Do not drive: If you have been drinking alcohol. Do not ride with someone who has been drinking. When you are tired or distracted. While texting. If you have been using any mind-altering substances or drugs. Wear a helmet and other protective equipment during sports activities. If you have firearms in your house, make sure you follow all gun safety procedures. Seek help if you have been physically or sexually abused. What's next? Visit your health care provider once a year for an annual wellness visit. Ask your health care provider how often you should have your eyes and teeth checked. Stay up to date on all vaccines. This information is not intended to replace advice given to you by your health care provider. Make sure you discuss any questions you have with your health care provider. Document Revised: 08/10/2020 Document Reviewed: 08/10/2020 Elsevier Patient Education  [place].

## 2022-04-27 ENCOUNTER — Other Ambulatory Visit: Payer: Self-pay | Admitting: Nurse Practitioner

## 2022-04-27 DIAGNOSIS — F909 Attention-deficit hyperactivity disorder, unspecified type: Secondary | ICD-10-CM

## 2022-04-30 MED ORDER — AMPHETAMINE-DEXTROAMPHET ER 30 MG PO CP24: 30.0000 mg | ORAL_CAPSULE | Freq: Every day | ORAL | 0 refills | Status: DC

## 2022-05-11 ENCOUNTER — Ambulatory Visit: Payer: BC Managed Care – PPO | Admitting: Nurse Practitioner

## 2022-05-11 ENCOUNTER — Encounter: Payer: Self-pay | Admitting: Nurse Practitioner

## 2022-05-11 VITALS — BP 90/70 | HR 105 | Temp 98.2°F | Resp 16 | Ht 67.0 in | Wt 160.1 lb

## 2022-05-11 DIAGNOSIS — F909 Attention-deficit hyperactivity disorder, unspecified type: Secondary | ICD-10-CM

## 2022-05-11 DIAGNOSIS — Z79899 Other long term (current) drug therapy: Secondary | ICD-10-CM

## 2022-05-11 MED ORDER — AMPHETAMINE-DEXTROAMPHET ER 30 MG PO CP24: 30.0000 mg | ORAL_CAPSULE | Freq: Every day | ORAL | 0 refills | Status: DC

## 2022-05-11 NOTE — Progress Notes (Signed)
   Established Patient Office Visit  Subjective   Patient ID: Valerie Cole, female    DOB: October 16, 1970  Age: 52 y.o. MRN: [id number]  Chief Complaint  Patient presents with   Medication Refill      ADHD: patietn is currently on adderall xr 30 mg daily Last uds was 04/2021 States that when she does not take it she has a hard time focusing States that she feels like she is use to the medications. Unsure if it is helping. 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  Respiratory:  Negative for shortness of breath.   Cardiovascular:  Negative for chest pain.  Neurological:  Negative for headaches.  Psychiatric/Behavioral:  Negative for hallucinations and suicidal ideas. The patient does not have insomnia.       Objective:     BP 90/70   Pulse (!) 105   Temp 98.2 F (36.8 C)   Resp 16   Ht 5\' 7"  (1.702 m)   Wt 160 lb 2 oz (72.6 kg)   LMP 04/10/2022   SpO2 99%   BMI 25.08 kg/m  BP Readings from Last 3 Encounters:  05/11/22 90/70  02/07/22 116/79  11/10/21 104/70   Wt Readings from Last 3 Encounters:  05/11/22 160 lb 2 oz (72.6 kg)  02/07/22 151 lb 11.2 oz (68.8 kg)  11/10/21 164 lb 2 oz (74.4 kg)      Physical Exam Vitals and nursing note reviewed.  Constitutional:      Appearance: Normal appearance.  Cardiovascular:     Rate and Rhythm: Normal rate and regular rhythm.     Heart sounds: Normal heart sounds.  Pulmonary:     Effort: Pulmonary effort is normal.     Breath sounds: Normal breath sounds.  Neurological:     Mental Status: She is alert.      No results found for any visits on 05/11/22.    The 10-year ASCVD risk score (Arnett DK, et al., 2019) is: 0.4%    Assessment & Plan:   Problem List Items Addressed This Visit       Other   Adult ADHD - Primary    Patient currently maintained on  Adderall 30 mg XR daily.  No red flags in office.  PDMP reviewed.  Refill prescriptions provided update controlled substance agreement and UDS today      Relevant Medications   amphetamine-dextroamphetamine (ADDERALL XR) 30 MG 24 hr capsule   amphetamine-dextroamphetamine (ADDERALL XR) 30 MG 24 hr capsule   amphetamine-dextroamphetamine (ADDERALL XR) 30 MG 24 hr capsule   Other Relevant Orders   DRUG MONITORING, PANEL 8 WITH CONFIRMATION, URINE   High risk medication use    Patient on controlled substance for ADHD.  Update controlled substance agreement along with UDS today.      Relevant Orders   DRUG MONITORING, PANEL 8 WITH CONFIRMATION, URINE    Return in about 6 months (around 11/11/2022) for CPE and Labs.    Romilda Garret, NP

## 2022-05-11 NOTE — Assessment & Plan Note (Signed)
Patient on controlled substance for ADHD.  Update controlled substance agreement along with UDS today.

## 2022-05-11 NOTE — Assessment & Plan Note (Signed)
Patient currently maintained on Adderall 30 mg XR daily.  No red flags in office.  PDMP reviewed.  Refill prescriptions provided update controlled substance agreement and UDS today

## 2022-05-11 NOTE — Patient Instructions (Signed)
Nice to see you today I will see you in 6 months for your physical and labs, sooner if you need me

## 2022-05-13 LAB — DRUG MONITORING, PANEL 8 WITH CONFIRMATION, URINE
6 Acetylmorphine: NEGATIVE ng/mL (ref <10)
Alcohol Metabolites: NEGATIVE ng/mL (ref <500)
Amphetamine: 7273 ng/mL — ABNORMAL HIGH (ref <250)
Amphetamines: POSITIVE ng/mL — AB (ref <500)
Benzodiazepines: NEGATIVE ng/mL (ref <100)
Buprenorphine, Urine: NEGATIVE ng/mL (ref <5)
Cocaine Metabolite: NEGATIVE ng/mL (ref <150)
Creatinine: 95.5 mg/dL (ref >=20.0)
MDMA: NEGATIVE ng/mL (ref <500)
Marijuana Metabolite: NEGATIVE ng/mL (ref <20)
Methamphetamine: NEGATIVE ng/mL (ref <250)
Opiates: NEGATIVE ng/mL (ref <100)
Oxidant: NEGATIVE ug/mL (ref <200)
Oxycodone: NEGATIVE ng/mL (ref <100)
pH: 6.5 (ref 4.5–9.0)

## 2022-05-13 LAB — DM TEMPLATE

## 2022-08-05 ENCOUNTER — Other Ambulatory Visit: Payer: Self-pay | Admitting: Nurse Practitioner

## 2022-08-05 DIAGNOSIS — F909 Attention-deficit hyperactivity disorder, unspecified type: Secondary | ICD-10-CM

## 2022-08-06 ENCOUNTER — Other Ambulatory Visit: Payer: Self-pay | Admitting: Nurse Practitioner

## 2022-08-06 NOTE — Telephone Encounter (Signed)
Patient contacted the office regarding this issue, stated that the [facility] on [address] in [place] does have this medication in stock. Patient asked if the refill could be sent there. Please advise, thank you

## 2022-08-06 NOTE — Telephone Encounter (Signed)
Patient contacted the office regarding this refill, state she is going out of town today and requested a refill before she leaves. Please advise, thank you.

## 2022-08-06 NOTE — Telephone Encounter (Signed)
Called the pharmacy and there is a script on file. The tech said that the medication is out of stock there currently

## 2022-08-07 MED ORDER — AMPHETAMINE-DEXTROAMPHET ER 30 MG PO CP24: 30.0000 mg | ORAL_CAPSULE | Freq: Every day | ORAL | 0 refills | Status: DC

## 2022-10-03 ENCOUNTER — Encounter: Payer: Self-pay | Admitting: Nurse Practitioner

## 2022-10-03 ENCOUNTER — Telehealth (INDEPENDENT_AMBULATORY_CARE_PROVIDER_SITE_OTHER): Payer: BC Managed Care – PPO | Admitting: Nurse Practitioner

## 2022-10-03 DIAGNOSIS — L239 Allergic contact dermatitis, unspecified cause: Secondary | ICD-10-CM | POA: Diagnosis not present

## 2022-10-03 MED ORDER — PREDNISONE 10 MG (21) PO TBPK: ORAL_TABLET | ORAL | 0 refills | Status: DC

## 2022-10-03 MED ORDER — HYDROXYZINE PAMOATE 25 MG PO CAPS: 25.0000 mg | ORAL_CAPSULE | Freq: Every evening | ORAL | 0 refills | Status: DC | PRN

## 2022-10-03 NOTE — Progress Notes (Signed)
Virtual Visit via Video Note  I connected withNAME@ on 10/03/22 at  2:00 PM EDT by a video enabled telemedicine application and verified that I am speaking with the correct person using two identifiers.  Location: Patient:Home Provider: Office Participants: patient and provider  I discussed the limitations of evaluation and management by telemedicine and the availability of in person appointments. I also discussed with the patient that there may be a patient responsible charge related to this service. The patient expressed understanding and agreed to proceed.  [id number]  History of Present Illness: Ms. Suer presents with rash on torso and upper legs. Onset on Saturday. Previously had rash on face after gardening on Wednesday, but this resolved with use of cortisone cream.  With went swimming in community pool on Friday (wore a 1 piece swimsuit and used sunscreen spray). Associated with itching and prickly sensation. No improvement with oral benadryl and cortisone. Denies any URI, fever or chills or malaise or insect bite. Denies any contact with poison ivy/oak. Denies any change in personal skin products or detergent or new clothing  Observations/Objective: Physical Exam Vitals reviewed.  Skin:    Findings: Erythema and rash present. Rash is macular.       Neurological:     Mental Status: She is alert.     Assessment and Plan: Tonia was seen today for rash.  Diagnoses and all orders for this visit:  Allergic contact dermatitis, unspecified trigger -     predniSONE (STERAPRED UNI-PAK 21 TAB) 10 MG (21) TBPK tablet; As directed on package -     hydrOXYzine (VISTARIL) 25 MG capsule; Take 1-2 capsules (25-50 mg total) by mouth at bedtime as needed.   Follow Up Instructions: Heat rash vs contact dermatitis Hold zyrtec and benadryl while using vistaril Avoid excessive sun exposure Avoid hot showers Use caladryl lotion to soothe rash Call office if no improvement in 1week   I  discussed the assessment and treatment plan with the patient. The patient was provided an opportunity to ask questions and all were answered. The patient agreed with the plan and demonstrated an understanding of the instructions.   The patient was advised to call back or seek an in-person evaluation if the symptoms worsen or if the condition fails to improve as anticipated.  Alysia Penna, NP

## 2022-10-03 NOTE — Patient Instructions (Signed)
Hold zyrtec and benadryl while using vistaril Avoid excessive sun exposure Avoid hot showers Use caladryl lotion to soothe rash Call office if no improvement in 1week

## 2022-10-10 ENCOUNTER — Other Ambulatory Visit: Payer: Self-pay | Admitting: Nurse Practitioner

## 2022-10-10 DIAGNOSIS — F909 Attention-deficit hyperactivity disorder, unspecified type: Secondary | ICD-10-CM

## 2022-10-11 ENCOUNTER — Encounter (INDEPENDENT_AMBULATORY_CARE_PROVIDER_SITE_OTHER): Payer: Self-pay

## 2022-10-11 ENCOUNTER — Telehealth: Payer: Self-pay | Admitting: Nurse Practitioner

## 2022-10-11 NOTE — Telephone Encounter (Signed)
Prescription Request  10/11/2022  LOV: 05/11/2022  What is the name of the medication or equipment? amphetamine-dextroamphetamine (ADDERALL XR) 30 MG 24 hr capsule   Have you contacted your pharmacy to request a refill? Yes   Which pharmacy would you like this sent   [facility] [address] [place], [place] - [address] [place] [address] Phone: [phone] Fax: [phone]  Patient notified that their request is being sent to the clinical staff for review and that they should receive a response within 2 business days.   Please advise at Mobile [phone] (mobile)

## 2022-10-11 NOTE — Telephone Encounter (Signed)
Pt called in stated she is all out f her medication amphetamine-dextroamphetamine (ADDERALL XR) 30 MG 24 hr capsule . To [facility] [address] [place]

## 2022-10-12 MED ORDER — AMPHETAMINE-DEXTROAMPHET ER 30 MG PO CP24
30.0000 mg | ORAL_CAPSULE | Freq: Every day | ORAL | 0 refills | Status: DC
Start: 2022-10-12 — End: 2022-11-08

## 2022-10-12 NOTE — Telephone Encounter (Signed)
LAST APPOINTMENT DATE: 05/11/2022   NEXT APPOINTMENT DATE: 11/14/2022  Adderall 30mg   LAST REFILL: 05/11/2022  QTY: #[id number]

## 2022-10-12 NOTE — Telephone Encounter (Signed)
Patient contacted the office regarding this request again, says she has been out of medication for 3 days now and wanted to make sure she has medication for the weekend.

## 2022-11-08 ENCOUNTER — Other Ambulatory Visit: Payer: Self-pay | Admitting: Nurse Practitioner

## 2022-11-08 DIAGNOSIS — F909 Attention-deficit hyperactivity disorder, unspecified type: Secondary | ICD-10-CM

## 2022-11-08 MED ORDER — AMPHETAMINE-DEXTROAMPHET ER 30 MG PO CP24
30.0000 mg | ORAL_CAPSULE | Freq: Every day | ORAL | 0 refills | Status: DC
Start: 2022-11-08 — End: 2022-12-10

## 2022-11-14 ENCOUNTER — Ambulatory Visit (INDEPENDENT_AMBULATORY_CARE_PROVIDER_SITE_OTHER): Payer: BC Managed Care – PPO | Admitting: Nurse Practitioner

## 2022-11-14 ENCOUNTER — Encounter: Payer: Self-pay | Admitting: Nurse Practitioner

## 2022-11-14 VITALS — BP 120/82 | HR 101 | Temp 98.3°F | Ht 67.0 in | Wt 164.8 lb

## 2022-11-14 DIAGNOSIS — Z1211 Encounter for screening for malignant neoplasm of colon: Secondary | ICD-10-CM

## 2022-11-14 DIAGNOSIS — Z0000 Encounter for general adult medical examination without abnormal findings: Secondary | ICD-10-CM

## 2022-11-14 DIAGNOSIS — R7303 Prediabetes: Secondary | ICD-10-CM

## 2022-11-14 DIAGNOSIS — D6851 Activated protein C resistance: Secondary | ICD-10-CM | POA: Diagnosis not present

## 2022-11-14 DIAGNOSIS — E559 Vitamin D deficiency, unspecified: Secondary | ICD-10-CM

## 2022-11-14 DIAGNOSIS — Z1231 Encounter for screening mammogram for malignant neoplasm of breast: Secondary | ICD-10-CM

## 2022-11-14 DIAGNOSIS — F909 Attention-deficit hyperactivity disorder, unspecified type: Secondary | ICD-10-CM | POA: Diagnosis not present

## 2022-11-14 DIAGNOSIS — E663 Overweight: Secondary | ICD-10-CM | POA: Diagnosis not present

## 2022-11-14 DIAGNOSIS — M25522 Pain in left elbow: Secondary | ICD-10-CM | POA: Insufficient documentation

## 2022-11-14 LAB — COMPREHENSIVE METABOLIC PANEL WITH GFR
ALT: 12 U/L (ref 0–35)
AST: 15 U/L (ref 0–37)
Albumin: 4.3 g/dL (ref 3.5–5.2)
Alkaline Phosphatase: 61 U/L (ref 39–117)
BUN: 19 mg/dL (ref 6–23)
CO2: 28 meq/L (ref 19–32)
Calcium: 9.2 mg/dL (ref 8.4–10.5)
Chloride: 104 meq/L (ref 96–112)
Creatinine, Ser: 0.92 mg/dL (ref 0.40–1.20)
GFR: 72.83 mL/min (ref >60.00)
Glucose, Bld: 83 mg/dL (ref 70–99)
Potassium: 4.1 meq/L (ref 3.5–5.1)
Sodium: 139 meq/L (ref 135–145)
Total Bilirubin: 0.7 mg/dL (ref 0.2–1.2)
Total Protein: 6.9 g/dL (ref 6.0–8.3)

## 2022-11-14 LAB — CBC
HCT: 43.7 % (ref 36.0–46.0)
Hemoglobin: 14.4 g/dL (ref 12.0–15.0)
MCHC: 32.9 g/dL (ref 30.0–36.0)
MCV: 86.1 fl (ref 78.0–100.0)
Platelets: 275 10*3/uL (ref 150.0–400.0)
RBC: 5.08 Mil/uL (ref 3.87–5.11)
RDW: 13.6 % (ref 11.5–15.5)
WBC: 8.9 10*3/uL (ref 4.0–10.5)

## 2022-11-14 LAB — HEMOGLOBIN A1C: Hgb A1c MFr Bld: 5.4 % (ref 4.6–6.5)

## 2022-11-14 LAB — LIPID PANEL
Cholesterol: 162 mg/dL (ref 0–200)
HDL: 63.1 mg/dL (ref >39.00)
LDL Cholesterol: 86 mg/dL (ref 0–99)
NonHDL: 99.33
Total CHOL/HDL Ratio: 3
Triglycerides: 69 mg/dL (ref 0.0–149.0)
VLDL: 13.8 mg/dL (ref 0.0–40.0)

## 2022-11-14 LAB — TSH: TSH: 1.18 u[IU]/mL (ref 0.35–5.50)

## 2022-11-14 NOTE — Progress Notes (Signed)
New Patient Office Visit  Subjective    Patient ID: Valerie Cole, female    DOB: Mar 02, 1970  Age: 52 y.o. MRN: [id number]  CC:  Chief Complaint  Patient presents with   Annual Exam   Ear Pain    Pt complains of left ear discomfort. Suspects wax build up.    elbow pain    Pt complains of falling from hammock in July. States she still cannot put pressure on her left elbow without feeling achy.     HPI Valerie Cole presents to establish care   ADHD: states that she is on adderrall 30 xr. States that no palpatations. State that she will sleep 6 hours. When she does not have her medicine she will get 8 hours.   Elbow pain: left states she fell on it in June. States that it has improved. States that when it rains it aches. States that she cannot apply pressure. States that there ar no numbness. States that she did use heat, ice, and otc analgesics   for complete physical and follow up of chronic conditions.  Immunizations: -Tetanus: Completed in unsure  -Influenza: refused -Shingles: refused  -Pneumonia: too young   Diet: Fair diet. States that she does optiva 1 meal and fueling. States that she is doing coffee on occasion. Water 1 diet ginger ale a day.  Exercise: Every day at 30-79mins. Over the summer she would swim. She uses a total gym and elpitical   Eye exam: Completes annually. Glasses for driving   Dental exam: Completes semi-annually    Colonoscopy: ambulatory referral today  Lung Cancer Screening: NA  Pap smear: 01/25/2021 with GYN  Mammogram: 11/17/2021  Dexa: too young     Outpatient Encounter Medications as of 11/14/2022  Medication Sig   acidophilus (RISAQUAD) CAPS capsule Take 1 capsule by mouth daily.   amphetamine-dextroamphetamine (ADDERALL XR) 30 MG 24 hr capsule Take 1 capsule (30 mg total) by mouth daily.   amphetamine-dextroamphetamine (ADDERALL XR) 30 MG 24 hr capsule Take 1 capsule (30 mg total) by mouth daily.   cetirizine (ZYRTEC) 10 MG  tablet Take 10 mg by mouth daily.   Chaste Tree (VITEX EXTRACT PO) Take 400 mg by mouth daily at 6 (six) AM.   Cholecalciferol (VITAMIN D) 50 MCG (2000 UT) tablet Take 2,000 Units by mouth daily.   hydrocortisone (PROCTO-MED HC) 2.5 % rectal cream Place 1 Application rectally 2 (two) times daily. For a week   hydrOXYzine (VISTARIL) 25 MG capsule Take 1-2 capsules (25-50 mg total) by mouth at bedtime as needed.   amphetamine-dextroamphetamine (ADDERALL XR) 30 MG 24 hr capsule Take 1 capsule (30 mg total) by mouth daily.   predniSONE (STERAPRED UNI-PAK 21 TAB) 10 MG (21) TBPK tablet As directed on package (Patient not taking: Reported on 11/14/2022)   Facility-Administered Encounter Medications as of 11/14/2022  Medication   cephALEXin (KEFLEX) capsule 500 mg   ondansetron (ZOFRAN) injection 4 mg    Past Medical History:  Diagnosis Date   Anxiety    Factor 5 Leiden mutation, heterozygous [facility])     Past Surgical History:  Procedure Laterality Date   CESAREAN SECTION     colapsed lung     EXTRACORPOREAL SHOCK WAVE LITHOTRIPSY Left 07/13/2021   Procedure: EXTRACORPOREAL SHOCK WAVE LITHOTRIPSY (ESWL);  Surgeon: Vanna Scotland, MD;  Location: [facility] ORS;  Service: Urology;  Laterality: Left;   kidney stint     kidney stent   LITHOTRIPSY      Family  History  Problem Relation Age of Onset   Hypertension Mother    Breast cancer Mother 60   Arthritis Mother    Heart disease Mother    Hyperlipidemia Mother    Diabetes Mother    Arthritis Father    Diabetes Father    Hypertension Maternal Grandmother    Arthritis Maternal Grandmother    Hyperlipidemia Maternal Grandmother    Heart disease Maternal Grandmother    Heart disease Paternal Grandfather     Social History   Socioeconomic History   Marital status: Married    Spouse name: Not on file   Number of children: Not on file   Years of education: Not on file   Highest education level: Not on file  Occupational History   Not on  file  Tobacco Use   Smoking status: Never   Smokeless tobacco: Never  Substance and Sexual Activity   Alcohol use: No   Drug use: No   Sexual activity: Yes  Other Topics Concern   Not on file  Social History Narrative   Not on file   Social Determinants of Health   Financial Resource Strain: Not on file  Food Insecurity: Not on file  Transportation Needs: Not on file  Physical Activity: Not on file  Stress: Not on file  Social Connections: Not on file  Intimate Partner Violence: Not on file    Review of Systems  Constitutional:  Negative for chills and fever.  Respiratory:  Negative for shortness of breath.   Cardiovascular:  Negative for chest pain and leg swelling.  Gastrointestinal:  Negative for abdominal pain, blood in stool, constipation, diarrhea, nausea and vomiting.       Bm daily   Genitourinary:  Negative for dysuria and hematuria.  Neurological:  Negative for tingling and headaches.  Psychiatric/Behavioral:  Negative for hallucinations and suicidal ideas.         Objective    BP 120/82   Pulse (!) 101   Temp 98.3 F (36.8 C) (Temporal)   Ht 5\' 7"  (1.702 m)   Wt 164 lb 12.8 oz (74.8 kg)   SpO2 98%   BMI 25.81 kg/m   Physical Exam Vitals and nursing note reviewed.  Constitutional:      Appearance: Normal appearance.  HENT:     Right Ear: Tympanic membrane, ear canal and external ear normal.     Left Ear: Tympanic membrane, ear canal and external ear normal.     Mouth/Throat:     Mouth: Mucous membranes are moist.     Pharynx: Oropharynx is clear.  Eyes:     Extraocular Movements: Extraocular movements intact.     Pupils: Pupils are equal, round, and reactive to light.  Cardiovascular:     Rate and Rhythm: Normal rate and regular rhythm.     Pulses: Normal pulses.     Heart sounds: Normal heart sounds.  Pulmonary:     Effort: Pulmonary effort is normal.     Breath sounds: Normal breath sounds.  Abdominal:     General: Bowel sounds are  normal. There is no distension.     Palpations: There is no mass.     Tenderness: There is no abdominal tenderness.     Hernia: No hernia is present.  Musculoskeletal:     Left elbow: No swelling or effusion. Normal range of motion. No tenderness.     Right lower leg: No edema.     Left lower leg: No edema.  Lymphadenopathy:  Cervical: No cervical adenopathy.  Skin:    General: Skin is warm.  Neurological:     General: No focal deficit present.     Mental Status: She is alert.     Deep Tendon Reflexes:     Reflex Scores:      Bicep reflexes are 2+ on the right side and 2+ on the left side.      Patellar reflexes are 2+ on the right side and 2+ on the left side.    Comments: Bilateral upper and lower extremity strength 5/5  Psychiatric:        Mood and Affect: Mood normal.        Behavior: Behavior normal.        Thought Content: Thought content normal.        Judgment: Judgment normal.         Assessment & Plan:   Problem List Items Addressed This Visit       Hematopoietic and Hemostatic   Factor 5 Leiden mutation, heterozygous (HCC)    Stable avoid any medications that increases risk for clotting        Other   Adult ADHD    Patient currently maintained on Adderall 30 mg daily.  States on medication she can sleep about 6 hours at night off medication 8 hours a night.  Did offer to increase the dose to 25 mg daily.  Patient declined continue Adderall 30 mg daily      Vitamin D deficiency   Preventative health care - Primary    Discussed age-appropriate immunizations and screening exams.  Did review patient's personal, surgical, social, family histories.  Patient is up-to-date on all age-appropriate vaccinations she would like.  Patient deferred shingles vaccine today.  Ambulatory referral to GI for CRC screening.  Patient is followed by GYN for cervical cancer screening.  Mammogram is due.  Order placed.  Patient information at discharge about preventative  healthcare maintenance with anticipatory guidance      Relevant Orders   CBC   Comprehensive metabolic panel   TSH   Prediabetes    History of the same.  Patient does live healthy lifestyle.  Pending A1c      Relevant Orders   Lipid panel   Hemoglobin A1c   Overweight (BMI 25.0-29.9)    Pending TSH, lipid panel, A1c.  Continue practicing healthy lifestyle modifications      Relevant Orders   Lipid panel   Left elbow pain    Status post injury.  Nontender to palpation no acute abnormalities offered image patient declined to using over-the-counter analgesics as needed      Other Visit Diagnoses     Screening for colon cancer       Relevant Orders   Ambulatory referral to Gastroenterology   Screening mammogram for breast cancer       Relevant Orders   MM 3D SCREENING MAMMOGRAM BILATERAL BREAST       Return in about 6 months (around 05/14/2023) for Medication recheck .   Audria Nine, NP

## 2022-11-14 NOTE — Assessment & Plan Note (Signed)
Stable avoid any medications that increases risk for clotting

## 2022-11-14 NOTE — Assessment & Plan Note (Signed)
Status post injury.  Nontender to palpation no acute abnormalities offered image patient declined to using over-the-counter analgesics as needed

## 2022-11-14 NOTE — Assessment & Plan Note (Signed)
Discussed age-appropriate immunizations and screening exams.  Did review patient's personal, surgical, social, family histories.  Patient is up-to-date on all age-appropriate vaccinations she would like.  Patient deferred shingles vaccine today.  Ambulatory referral to GI for CRC screening.  Patient is followed by GYN for cervical cancer screening.  Mammogram is due.  Order placed.  Patient information at discharge about preventative healthcare maintenance with anticipatory guidance

## 2022-11-14 NOTE — Patient Instructions (Signed)
Nice to see you today I will be in touch with the labs once I have reviewed the Follow up with me in 6 months, sooner if you need me

## 2022-11-14 NOTE — Assessment & Plan Note (Signed)
Patient currently maintained on Adderall 30 mg daily.  States on medication she can sleep about 6 hours at night off medication 8 hours a night.  Did offer to increase the dose to 25 mg daily.  Patient declined continue Adderall 30 mg daily

## 2022-11-14 NOTE — Assessment & Plan Note (Signed)
Pending TSH, lipid panel, A1c.  Continue practicing healthy lifestyle modifications

## 2022-11-14 NOTE — Assessment & Plan Note (Signed)
History of the same.  Patient does live healthy lifestyle.  Pending A1c

## 2022-11-28 ENCOUNTER — Encounter: Payer: Self-pay | Admitting: *Deleted

## 2022-12-10 ENCOUNTER — Telehealth: Payer: Self-pay | Admitting: Nurse Practitioner

## 2022-12-10 DIAGNOSIS — F909 Attention-deficit hyperactivity disorder, unspecified type: Secondary | ICD-10-CM

## 2022-12-10 MED ORDER — AMPHETAMINE-DEXTROAMPHET ER 30 MG PO CP24
30.0000 mg | ORAL_CAPSULE | Freq: Every day | ORAL | 0 refills | Status: DC
Start: 2022-12-10 — End: 2023-03-11

## 2022-12-10 MED ORDER — AMPHETAMINE-DEXTROAMPHET ER 30 MG PO CP24
30.0000 mg | ORAL_CAPSULE | Freq: Every day | ORAL | 0 refills | Status: DC
Start: 2022-12-10 — End: 2023-03-12

## 2022-12-10 NOTE — Telephone Encounter (Signed)
LAST APPOINTMENT DATE: 11/14/2022   NEXT APPOINTMENT DATE: 05/14/2023   Adderall 30mg    LAST REFILL: 11/08/22  QTY: #30 0RF

## 2022-12-10 NOTE — Telephone Encounter (Signed)
Prescription Request  12/10/2022  LOV: 11/14/2022  What is the name of the medication or equipment? amphetamine-dextroamphetamine (ADDERALL XR) 30 MG 24 hr capsule   Have you contacted your pharmacy to request a refill? Yes   Which pharmacy would you like this sent to?  [facility] [address] [place] - [address] [place] [address] Phone: [phone] Fax: [phone]     Patient notified that their request is being sent to the clinical staff for review and that they should receive a response within 2 business days.   Please advise at Mobile [phone] (mobile)

## 2022-12-10 NOTE — Telephone Encounter (Signed)
Refills provided to the requested pharmacy

## 2022-12-12 ENCOUNTER — Encounter: Payer: Self-pay | Admitting: Nurse Practitioner

## 2022-12-12 ENCOUNTER — Telehealth: Payer: Self-pay | Admitting: Nurse Practitioner

## 2022-12-12 NOTE — Telephone Encounter (Signed)
error 

## 2022-12-12 NOTE — Telephone Encounter (Signed)
Left message to return call to our office.  

## 2022-12-12 NOTE — Telephone Encounter (Signed)
Patient would like to know if Valerie Cole could possibly refill medication fluconazole (DIFLUCAN) 150 MG tablet? Pt says she was recently taking an antibiotic given by her urologist and it has given her a yeast infection. If possible, pt asked for this to be filled at [facility] on [address].

## 2022-12-17 NOTE — Telephone Encounter (Signed)
Unable to reach patient. Left voicemail to return call to our office.   

## 2022-12-17 NOTE — Telephone Encounter (Signed)
Left message to call office you need to see her to treat right?

## 2022-12-17 NOTE — Telephone Encounter (Signed)
I cannot see the office note from the urologist so I would need more information

## 2022-12-21 NOTE — Telephone Encounter (Signed)
Left voicemail for patient to call the office back.   

## 2022-12-21 NOTE — Telephone Encounter (Signed)
We have called patient several times over last 9 days. I have called left message to call office. I have sent letter to call office in my chart message and to home address.

## 2022-12-24 NOTE — Telephone Encounter (Signed)
Noted  

## 2023-01-08 ENCOUNTER — Other Ambulatory Visit: Payer: Self-pay | Admitting: Nurse Practitioner

## 2023-01-08 DIAGNOSIS — F909 Attention-deficit hyperactivity disorder, unspecified type: Secondary | ICD-10-CM

## 2023-03-04 ENCOUNTER — Encounter: Payer: Self-pay | Admitting: Family

## 2023-03-04 ENCOUNTER — Ambulatory Visit: Payer: BC Managed Care – PPO | Admitting: Family

## 2023-03-04 VITALS — BP 120/76 | HR 112 | Temp 97.8°F | Ht 67.0 in | Wt 154.6 lb

## 2023-03-04 DIAGNOSIS — N390 Urinary tract infection, site not specified: Secondary | ICD-10-CM | POA: Insufficient documentation

## 2023-03-04 DIAGNOSIS — B3731 Acute candidiasis of vulva and vagina: Secondary | ICD-10-CM | POA: Diagnosis not present

## 2023-03-04 DIAGNOSIS — R300 Dysuria: Secondary | ICD-10-CM | POA: Diagnosis not present

## 2023-03-04 LAB — POCT URINE DIPSTICK
Bilirubin, UA: NEGATIVE
Glucose, UA: NEGATIVE mg/dL
Ketones, POC UA: NEGATIVE mg/dL
Nitrite, UA: NEGATIVE
POC PROTEIN,UA: 30 — AB
Spec Grav, UA: 1.015 (ref 1.010–1.025)
Urobilinogen, UA: 0.2 U/dL
pH, UA: 5.5 (ref 5.0–8.0)

## 2023-03-04 MED ORDER — NITROFURANTOIN MONOHYD MACRO 100 MG PO CAPS
ORAL_CAPSULE | ORAL | Status: DC
Start: 2023-03-04 — End: 2023-05-21

## 2023-03-04 MED ORDER — SULFAMETHOXAZOLE-TRIMETHOPRIM 800-160 MG PO TABS
1.0000 | ORAL_TABLET | Freq: Two times a day (BID) | ORAL | 0 refills | Status: AC
Start: 2023-03-04 — End: 2023-03-11

## 2023-03-04 MED ORDER — FLUCONAZOLE 150 MG PO TABS
ORAL_TABLET | ORAL | 0 refills | Status: DC
Start: 2023-03-04 — End: 2023-05-21

## 2023-03-04 NOTE — Assessment & Plan Note (Signed)
 Poct dip in office with leukocytes, trace of blood and symptomatic.  Will treat with bactrim , urinalysis with culture ordered pending results.  antbx sent to pharmacy, pt to take as directed. Encouraged increased water intake throughout the day. Choosing to treat due to being symptomatic. If no improvement in the next 2 days pt advised to let me know.

## 2023-03-04 NOTE — Progress Notes (Signed)
 Established Patient Office Visit  Subjective:   Patient ID: Valerie Cole, female    DOB: 10-29-1970  Age: 53 y.o. MRN: [id number]  CC:  Chief Complaint  Patient presents with   Urinary Tract Infection    HPI: Valerie Cole is a 53 y.o. female presenting on 03/04/2023 for Urinary Tract Infection  Three days ago started with dysuria, urinary frequency urgency.  No chills or fever. No abdominal pain or flank pain.  Taking ibuprofen  and cystex otc.  She does state she takes nitrofurantoin  100 mg prn up to one dose post coital (but she states this doesn't help her much)  Denies any vaginal discharge.  She does have kidney stones.     ROS: Negative unless specifically indicated above in HPI.   Relevant past medical history reviewed and updated as indicated.   Allergies and medications reviewed and updated.   Current Outpatient Medications:    acidophilus (RISAQUAD) CAPS capsule, Take 1 capsule by mouth daily., Disp: , Rfl:    amphetamine -dextroamphetamine (ADDERALL XR) 30 MG 24 hr capsule, Take 1 capsule (30 mg total) by mouth daily., Disp: 30 capsule, Rfl: 0   amphetamine -dextroamphetamine (ADDERALL XR) 30 MG 24 hr capsule, Take 1 capsule (30 mg total) by mouth daily., Disp: 30 capsule, Rfl: 0   amphetamine -dextroamphetamine (ADDERALL XR) 30 MG 24 hr capsule, Take 1 capsule (30 mg total) by mouth daily., Disp: 30 capsule, Rfl: 0   cetirizine (ZYRTEC) 10 MG tablet, Take 10 mg by mouth daily., Disp: , Rfl:    Chaste Tree (VITEX EXTRACT PO), Take 400 mg by mouth daily at 6 (six) AM., Disp: , Rfl:    Cholecalciferol (VITAMIN D ) 50 MCG (2000 UT) tablet, Take 2,000 Units by mouth daily., Disp: , Rfl:    fluconazole  (DIFLUCAN ) 150 MG tablet, Take one po every day for one dose, repeat in three days if still with symptoms, Disp: 2 tablet, Rfl: 0   hydrocortisone  (PROCTO-MED HC ) 2.5 % rectal cream, Place 1 Application rectally 2 (two) times daily. For a week, Disp: 30 g, Rfl: 0    hydrOXYzine  (VISTARIL ) 25 MG capsule, Take 1-2 capsules (25-50 mg total) by mouth at bedtime as needed., Disp: 14 capsule, Rfl: 0   nitrofurantoin , macrocrystal-monohydrate, (MACROBID ) 100 MG capsule, Take one po post coital prn, Disp: , Rfl:    sulfamethoxazole -trimethoprim  (BACTRIM  DS) 800-160 MG tablet, Take 1 tablet by mouth 2 (two) times daily for 7 days., Disp: 14 tablet, Rfl: 0  Current Facility-Administered Medications:    ondansetron  (ZOFRAN ) injection 4 mg, 4 mg, Intravenous, Once, Penne Knee, MD  Allergies  Allergen Reactions   Amoxicillin Itching   Focalin  Xr [Dexmethylphenidate  Hcl Er] Nausea Only    And broke out in sweat when taking on empty stomach.   Demerol Itching    Objective:   BP 120/76 (BP Location: Left Arm, Patient Position: Sitting, Cuff Size: Normal)   Pulse (!) 112   Temp 97.8 F (36.6 C) (Temporal)   Ht 5' 7 (1.702 m)   Wt 154 lb 9.6 oz (70.1 kg)   SpO2 98%   BMI 24.21 kg/m    Physical Exam Constitutional:      General: She is not in acute distress.    Appearance: Normal appearance. She is normal weight. She is not ill-appearing, toxic-appearing or diaphoretic.  Cardiovascular:     Rate and Rhythm: Normal rate.  Pulmonary:     Effort: Pulmonary effort is normal.  Abdominal:     General: Abdomen  is flat.     Tenderness: There is no abdominal tenderness. There is no right CVA tenderness or left CVA tenderness.  Neurological:     General: No focal deficit present.     Mental Status: She is alert and oriented to person, place, and time. Mental status is at baseline.     Motor: No weakness.  Psychiatric:        Mood and Affect: Mood normal.        Behavior: Behavior normal.        Thought Content: Thought content normal.        Judgment: Judgment normal.     Assessment & Plan:  Burning with urination Assessment & Plan: Poct dip in office with leukocytes, trace of blood and symptomatic.  Will treat with bactrim , urinalysis with  culture ordered pending results.  antbx sent to pharmacy, pt to take as directed. Encouraged increased water intake throughout the day. Choosing to treat due to being symptomatic. If no improvement in the next 2 days pt advised to let me know.   Orders: -     POCT URINE DIPSTICK -     Urinalysis w microscopic + reflex cultur -     Sulfamethoxazole -Trimethoprim ; Take 1 tablet by mouth 2 (two) times daily for 7 days.  Dispense: 14 tablet; Refill: 0  Recurrent UTI (urinary tract infection) -     Nitrofurantoin  Monohyd Macro; Take one po post coital prn -     Sulfamethoxazole -Trimethoprim ; Take 1 tablet by mouth 2 (two) times daily for 7 days.  Dispense: 14 tablet; Refill: 0  Vaginal candida -     Fluconazole ; Take one po every day for one dose, repeat in three days if still with symptoms  Dispense: 2 tablet; Refill: 0     Follow up plan: Return for f/u PCP if no improvement in symptoms.  Ginger Patrick, FNP

## 2023-03-06 ENCOUNTER — Encounter: Payer: Self-pay | Admitting: Family

## 2023-03-06 LAB — URINALYSIS W MICROSCOPIC + REFLEX CULTURE
Bacteria, UA: NONE SEEN /HPF
Bilirubin Urine: NEGATIVE
Glucose, UA: NEGATIVE
Ketones, ur: NEGATIVE
Nitrites, Initial: NEGATIVE
Specific Gravity, Urine: 1.016 (ref 1.001–1.035)
pH: 5.5 (ref 5.0–8.0)

## 2023-03-06 LAB — CULTURE INDICATED

## 2023-03-06 LAB — URINE CULTURE
MICRO NUMBER:: 15923320
Result:: NO GROWTH
SPECIMEN QUALITY:: ADEQUATE

## 2023-03-11 ENCOUNTER — Other Ambulatory Visit: Payer: Self-pay | Admitting: Nurse Practitioner

## 2023-03-11 DIAGNOSIS — F909 Attention-deficit hyperactivity disorder, unspecified type: Secondary | ICD-10-CM

## 2023-03-11 NOTE — Telephone Encounter (Signed)
 [email or web address]

## 2023-03-11 NOTE — Telephone Encounter (Signed)
 Can we try to get her back in to assess? Likely will do an XRAY to see if she has a kidney stone and also will likely do a pelvic/vaginal exam to assess for vaginal atrophy or other cause for vaginal discomfort. You can put her in my same days tomorrow.

## 2023-03-12 MED ORDER — AMPHETAMINE-DEXTROAMPHET ER 30 MG PO CP24: 30.0000 mg | ORAL_CAPSULE | Freq: Every day | ORAL | 0 refills | Status: DC

## 2023-03-12 MED ORDER — AMPHETAMINE-DEXTROAMPHET ER 30 MG PO CP24
30.0000 mg | ORAL_CAPSULE | Freq: Every day | ORAL | 0 refills | Status: DC
Start: 2023-03-12 — End: 2023-05-21

## 2023-03-15 ENCOUNTER — Ambulatory Visit
Admission: RE | Admit: 2023-03-15 | Discharge: 2023-03-15 | Disposition: A | Discharge status: Home / Self Care | Payer: BC Managed Care – PPO | Source: Ambulatory Visit | Attending: Physician Assistant

## 2023-03-15 ENCOUNTER — Ambulatory Visit: Payer: BC Managed Care – PPO | Admitting: Physician Assistant

## 2023-03-15 ENCOUNTER — Other Ambulatory Visit: Payer: Self-pay | Admitting: Physician Assistant

## 2023-03-15 ENCOUNTER — Encounter: Payer: Self-pay | Admitting: Physician Assistant

## 2023-03-15 ENCOUNTER — Other Ambulatory Visit
Admission: RE | Admit: 2023-03-15 | Discharge: 2023-03-15 | Disposition: A | Discharge status: Home / Self Care | Payer: BC Managed Care – PPO | Source: Home / Self Care | Attending: Physician Assistant | Admitting: Physician Assistant

## 2023-03-15 VITALS — BP 119/82 | HR 118 | Ht 67.0 in | Wt 150.0 lb

## 2023-03-15 DIAGNOSIS — N200 Calculus of kidney: Secondary | ICD-10-CM

## 2023-03-15 DIAGNOSIS — R109 Unspecified abdominal pain: Secondary | ICD-10-CM

## 2023-03-15 DIAGNOSIS — Z87442 Personal history of urinary calculi: Secondary | ICD-10-CM | POA: Diagnosis not present

## 2023-03-15 LAB — URINALYSIS, COMPLETE
Bilirubin, UA: NEGATIVE
Glucose, UA: NEGATIVE
Ketones, UA: NEGATIVE
Nitrite, UA: NEGATIVE
Protein,UA: NEGATIVE
Specific Gravity, UA: 1.015 (ref 1.005–1.030)
Urobilinogen, Ur: 0.2 mg/dL (ref 0.2–1.0)
pH, UA: 5.5 (ref 5.0–7.5)

## 2023-03-15 LAB — MICROSCOPIC EXAMINATION

## 2023-03-15 MED ORDER — TAMSULOSIN HCL 0.4 MG PO CAPS: 0.4000 mg | ORAL_CAPSULE | Freq: Every day | ORAL | 0 refills | Status: DC

## 2023-03-15 NOTE — Progress Notes (Signed)
03/15/2023 9:47 AM   Valerie Cole 1970/02/29 [id number]  CC: Chief Complaint  Patient presents with   Follow-up   Nephrolithiasis   HPI: Valerie Cole is a 53 y.o. female with PMH recurrent stone disease who presents today for evaluation of a possible acute stone episode.   Today she reports 2 weeks of dysuria, frequency, and mild right flank discomfort that have been improving with time.  She never saw a stone pass.  She saw her PCP for this 11 days ago and was started on empiric Bactrim, but her urine culture was negative so she stopped it after 3 days.  She denies fevers.  She notes that this has been going on within the context of alternating constipation, diarrhea, and gas pain.  She did 1 month detox diet to help her bowels, which turned out to be high in oxalate.  She is concerned that this may have caused a stone episode.  She notes that she is due for colonoscopy.  KUB today with stable bilateral renal stones.  There is a 2 to 3 mm calcification in the left hemipelvis consistent with phlebolith versus distal left ureteral stone.  In-office UA today positive for trace intact blood and 1+ leukocytes; urine microscopy with 11-30 WBCs/HPF and 3-10 RBCs/HPF.  PMH: Past Medical History:  Diagnosis Date   Anxiety    Factor 5 Leiden mutation, heterozygous [facility])     Surgical History: Past Surgical History:  Procedure Laterality Date   CESAREAN SECTION     colapsed lung     EXTRACORPOREAL SHOCK WAVE LITHOTRIPSY Left 07/13/2021   Procedure: EXTRACORPOREAL SHOCK WAVE LITHOTRIPSY (ESWL);  Surgeon: Vanna Scotland, MD;  Location: [facility] ORS;  Service: Urology;  Laterality: Left;   kidney stint     kidney stent   LITHOTRIPSY      Home Medications:  Allergies as of 03/15/2023       Reactions   Amoxicillin Itching   Focalin Xr [dexmethylphenidate Hcl Er] Nausea Only   And broke out in sweat when taking on empty stomach.   Demerol Itching        Medication List         Accurate as of March 15, 2023  9:47 AM. If you have any questions, ask your nurse or doctor.          acidophilus Caps capsule Take 1 capsule by mouth daily.   amphetamine-dextroamphetamine 30 MG 24 hr capsule Commonly known as: Adderall XR Take 1 capsule (30 mg total) by mouth daily.   amphetamine-dextroamphetamine 30 MG 24 hr capsule Commonly known as: ADDERALL XR Take 1 capsule (30 mg total) by mouth daily.   amphetamine-dextroamphetamine 30 MG 24 hr capsule Commonly known as: Adderall XR Take 1 capsule (30 mg total) by mouth daily.   cetirizine 10 MG tablet Commonly known as: ZYRTEC Take 10 mg by mouth daily.   fluconazole 150 MG tablet Commonly known as: DIFLUCAN Take one po every day for one dose, repeat in three days if still with symptoms   hydrocortisone 2.5 % rectal cream Commonly known as: Procto-Med HC Place 1 Application rectally 2 (two) times daily. For a week   hydrOXYzine 25 MG capsule Commonly known as: VISTARIL Take 1-2 capsules (25-50 mg total) by mouth at bedtime as needed.   nitrofurantoin (macrocrystal-monohydrate) 100 MG capsule Commonly known as: Macrobid Take one po post coital prn   Vitamin D 50 MCG (2000 UT) tablet Take 2,000 Units by mouth daily.   VITEX  EXTRACT PO Take 400 mg by mouth daily at 6 (six) AM.        Allergies:  Allergies  Allergen Reactions   Amoxicillin Itching   Focalin Xr [Dexmethylphenidate Hcl Er] Nausea Only    And broke out in sweat when taking on empty stomach.   Demerol Itching    Family History: Family History  Problem Relation Age of Onset   Hypertension Mother    Breast cancer Mother 70   Arthritis Mother    Heart disease Mother    Hyperlipidemia Mother    Diabetes Mother    Arthritis Father    Diabetes Father    Hypertension Maternal Grandmother    Arthritis Maternal Grandmother    Hyperlipidemia Maternal Grandmother    Heart disease Maternal Grandmother    Heart disease Paternal  Grandfather     Social History:   reports that she has never smoked. She has never been exposed to tobacco smoke. She has never used smokeless tobacco. She reports that she does not drink alcohol and does not use drugs.  Physical Exam: BP 119/82   Pulse (!) 118   Ht 5\' 7"  (1.702 m)   Wt 150 lb (68 kg)   BMI 23.49 kg/m   Constitutional:  Alert and oriented, no acute distress, nontoxic appearing HEENT: [place], AT Cardiovascular: No clubbing, cyanosis, or edema Respiratory: Normal respiratory effort, no increased work of breathing Skin: No rashes, bruises or suspicious lesions Neurologic: Grossly intact, no focal deficits, moving all 4 extremities Psychiatric: Normal mood and affect  Laboratory Data: Results for orders placed or performed in visit on 03/15/23  Microscopic Examination   Collection Time: 03/15/23  9:17 AM   Urine  Result Value Ref Range   WBC, UA 11-30 (A) 0 - 5 /hpf   RBC, Urine 3-10 (A) 0 - 2 /hpf   Epithelial Cells (non renal) 0-10 0 - 10 /hpf   Mucus, UA Present (A) Not Estab.   Bacteria, UA Few None seen/Few  Urinalysis, Complete   Collection Time: 03/15/23  9:17 AM  Result Value Ref Range   Specific Gravity, UA 1.015 1.005 - 1.030   pH, UA 5.5 5.0 - 7.5   Color, UA Yellow Yellow   Appearance Ur Clear Clear   Leukocytes,UA 1+ (A) Negative   Protein,UA Negative Negative/Trace   Glucose, UA Negative Negative   Ketones, UA Negative Negative   RBC, UA Trace (A) Negative   Bilirubin, UA Negative Negative   Urobilinogen, Ur 0.2 0.2 - 1.0 mg/dL   Nitrite, UA Negative Negative   Microscopic Examination See below:    Pertinent imaging: KUB, 03/15/2023: See Epic  I personally reviewed the imaging referenced above and note a small calcification in the left hemipelvis consistent with phlebolith versus distal left ureteral stone.  She has stable multiple bilateral renal stones, increased in number compared to prior.  Assessment & Plan:   1. Flank pain with  history of urolithiasis (Primary) Improving dysuria, frequency, and intermittent right flank discomfort.  UA is notable for pyuria and microscopic hematuria, although this could be attributable to her significant bilateral stone burden alone and does not specifically indicate an acute stone episode.  I see a small calcification in her left hemipelvis that could indicate a distal left ureteral stone, however I think this is more likely to be a phlebolith since her pain has been more on the right side.  I offered her a CT stone study today for definitive management but she had cost  concerns associated with this.  Ultimately, using shared decision making we elected to start empiric Flomax and have her follow-up with me in 2 weeks.  If she is no better at that point, we will proceed with CT stone study.  I am sending her urine for standard and atypical cultures today, though anticipate these will be negative given recent negative culture per her PCP.  We discussed return precautions including fever, uncontrolled pain, and nausea/vomiting. - Urinalysis, Complete - CULTURE, URINE COMPREHENSIVE - Mycoplasma / ureaplasma culture - tamsulosin (FLOMAX) 0.4 MG CAPS capsule; Take 1 capsule (0.4 mg total) by mouth daily.  Dispense: 30 capsule; Refill: 0   Return if symptoms worsen or fail to improve.  Carman Ching, PA-C  [facility] [place] [address], [address] [place], [place] [address] [phone]

## 2023-03-19 LAB — CULTURE, URINE COMPREHENSIVE

## 2023-03-22 LAB — MYCOPLASMA / UREAPLASMA CULTURE
Mycoplasma hominis Culture: NEGATIVE
Ureaplasma urealyticum: NEGATIVE

## 2023-04-01 ENCOUNTER — Other Ambulatory Visit: Payer: Self-pay | Admitting: Nurse Practitioner

## 2023-04-01 DIAGNOSIS — F909 Attention-deficit hyperactivity disorder, unspecified type: Secondary | ICD-10-CM

## 2023-04-04 ENCOUNTER — Telehealth: Payer: Self-pay

## 2023-04-04 DIAGNOSIS — F909 Attention-deficit hyperactivity disorder, unspecified type: Secondary | ICD-10-CM

## 2023-04-04 NOTE — Telephone Encounter (Signed)
 Reached out to [facility] and spoke with Houng. She added a note that the patient can get a refill on the 2/9 due to traveling. But its up to insurance if they will allow the refill. She advise that the patient call the morning on the 9th and let the person that she speaks with know that there is a note on file in reference to the early refill and they will at that point try to run it through insurance.

## 2023-04-04 NOTE — Telephone Encounter (Signed)
 Can we call the pharmacy and see if they will let the patient pick up the adderall script early since she is going out of town. She wants to pick on 04/07/2023

## 2023-04-04 NOTE — Telephone Encounter (Signed)
 Copied from CRM [id number]. Topic: Clinical - Prescription Issue >> Apr 04, 2023 10:46 AM Pinkey ORN wrote: Reason for CRM: amphetamine -dextroamphetamine (ADDERALL XR) 30 MG 24 hr capsule >> Apr 04, 2023 10:50 AM Pinkey ORN wrote: Patient states she sent a message via mychart stating she needs this medication filled sooner since she's going out of town to [place] . Patient is wanting to know has that request been completed and or if it's possible to go ahead and get it filled by the 9th so that she's able to pick up before leaving. Patient is requesting some sort of notification, via phone or mychart to let her know rather or not this can be done.

## 2023-04-05 MED ORDER — AMPHETAMINE-DEXTROAMPHET ER 30 MG PO CP24: 30.0000 mg | ORAL_CAPSULE | Freq: Every day | ORAL | 0 refills | Status: DC

## 2023-04-05 NOTE — Telephone Encounter (Signed)
 I sent a message to the pool yesterday asking someone to call the pharmacy and see if they will fill it that early. It was attached to a medication request. If someone could call and see if the pharmacy will allow an early fill

## 2023-04-05 NOTE — Addendum Note (Signed)
 Addended by: Dorothe Gaster on: 04/05/2023 10:11 AM   Modules accepted: Orders

## 2023-04-05 NOTE — Telephone Encounter (Signed)
 Script sent in for her to pick up on 04/07/2023

## 2023-04-05 NOTE — Telephone Encounter (Signed)
 Left voicemail for patient to call back and left mychart message. Pt is active.

## 2023-04-05 NOTE — Telephone Encounter (Signed)
 Contacted pharmacy.   Staff stated that they will only allow an early fill for the script if the fill date is changed and a new order has been placed.

## 2023-05-14 ENCOUNTER — Ambulatory Visit: Payer: BC Managed Care – PPO | Admitting: Nurse Practitioner

## 2023-05-21 ENCOUNTER — Ambulatory Visit: Payer: BC Managed Care – PPO | Admitting: Nurse Practitioner

## 2023-05-21 VITALS — BP 112/62 | HR 90 | Temp 98.0°F | Ht 67.0 in | Wt 153.8 lb

## 2023-05-21 DIAGNOSIS — F909 Attention-deficit hyperactivity disorder, unspecified type: Secondary | ICD-10-CM

## 2023-05-21 MED ORDER — AMPHETAMINE-DEXTROAMPHET ER 25 MG PO CP24: 25.0000 mg | ORAL_CAPSULE | ORAL | 0 refills | Status: DC

## 2023-05-21 NOTE — Assessment & Plan Note (Signed)
 Patient has been maintained on Adderall 30 mg XR once daily.  She would like to see if she can titrate down and still have the attention and focus needed.  Will trial patient on Adderall 25 mg XR once daily for the next month.  She will reach out when it is time for refill to either continue Adderall 25 mg or to go back up to her 30 mg daily.  PDMP reviewed

## 2023-05-21 NOTE — Patient Instructions (Signed)
 Nice to see you today I have sent in the 25mg  adderall. Reach out to me via mychart Follow up with me in 6 months for your physical, sooner if you need me

## 2023-05-21 NOTE — Progress Notes (Signed)
   Established Patient Office Visit  Subjective   Patient ID: Valerie Cole, female    DOB: May 15, 1970  Age: 53 y.o. 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  States that she is wanting to scale down but needs it to help teach her school. Her daughter is 43 years old currently.  She will still gets 6 hours of sleep with the medications and gets 8 hours off the medications.  Statse that she has to help with school work and and learning with her 76 year old daughter    Review of Systems  Constitutional:  Negative for chills and fever.  Respiratory:  Negative for shortness of breath.   Cardiovascular:  Negative for chest pain and palpitations.  Psychiatric/Behavioral:  Negative for hallucinations and suicidal ideas. The patient does not have insomnia.       Objective:     BP 112/62   Pulse 90   Temp 98 F (36.7 C) (Oral)   Ht 5\' 7"  (1.702 m)   Wt 153 lb 12.8 oz (69.8 kg)   SpO2 96%   BMI 24.09 kg/m  BP Readings from Last 3 Encounters:  05/21/23 112/62  03/15/23 119/82  03/04/23 120/76   Wt Readings from Last 3 Encounters:  05/21/23 153 lb 12.8 oz (69.8 kg)  03/15/23 150 lb (68 kg)  03/04/23 154 lb 9.6 oz (70.1 kg)   SpO2 Readings from Last 3 Encounters:  05/21/23 96%  03/04/23 98%  11/14/22 98%      Physical Exam Vitals and nursing note reviewed.  Constitutional:      Appearance: Normal appearance.  Cardiovascular:     Rate and Rhythm: Normal rate and regular rhythm.     Heart sounds: Normal heart sounds.  Pulmonary:     Effort: Pulmonary effort is normal.     Breath sounds: Normal breath sounds.  Neurological:     Mental Status: She is alert.      No results found for any visits on 05/21/23.    The 10-year ASCVD risk score (Arnett DK, et al., 2019) is: 0.6%     Assessment & Plan:   Problem List Items Addressed This Visit       Other   Adult ADHD - Primary   Patient has been maintained on Adderall 30 mg XR once daily.  She would like to see if she can titrate down and still have the attention and focus needed.  Will trial patient on Adderall 25 mg XR once daily for the next month.  She will reach out when it is time for refill to either continue Adderall 25 mg or to go back up to her 30 mg daily.  PDMP reviewed      Relevant Medications   amphetamine-dextroamphetamine (ADDERALL XR) 25 MG 24 hr capsule    Return in about 6 months (around 11/21/2023) for CPE and Labs.    Audria Nine, NP

## 2023-06-26 ENCOUNTER — Other Ambulatory Visit: Payer: Self-pay | Admitting: Nurse Practitioner

## 2023-06-26 DIAGNOSIS — F909 Attention-deficit hyperactivity disorder, unspecified type: Secondary | ICD-10-CM

## 2023-06-28 MED ORDER — AMPHETAMINE-DEXTROAMPHET ER 25 MG PO CP24: 25.0000 mg | ORAL_CAPSULE | ORAL | 0 refills | Status: DC

## 2023-06-28 NOTE — Telephone Encounter (Signed)
 Name of Medication: Adderall XR Name of Pharmacy: [facility] [address] Last Fill or Written Date and Quantity: 05/21/23 #30 caps/ 0 refills  Last Office Visit and Type: 05/21/23  6 month f/u Next Office Visit and Type: 11/21/23 CPE

## 2023-08-28 ENCOUNTER — Other Ambulatory Visit: Payer: Self-pay | Admitting: Nurse Practitioner

## 2023-08-28 DIAGNOSIS — F909 Attention-deficit hyperactivity disorder, unspecified type: Secondary | ICD-10-CM

## 2023-08-28 MED ORDER — AMPHETAMINE-DEXTROAMPHET ER 25 MG PO CP24
25.0000 mg | ORAL_CAPSULE | ORAL | 0 refills | Status: DC
Start: 2023-08-28 — End: 2023-09-25

## 2023-08-28 NOTE — Telephone Encounter (Signed)
 Copied from CRM [id number]. Topic: Clinical - Medication Refill >> Aug 28, 2023  8:50 AM Ernestene P wrote: Medication: amphetamine -dextroamphetamine (ADDERALL XR) 25 MG 24 hr capsule  Has the patient contacted their pharmacy? It's a controlled substance (Agent: If no, request that the patient contact the pharmacy for the refill. If patient does not wish to contact the pharmacy document the reason why and proceed with request.) (Agent: If yes, when and what did the pharmacy advise?)  This is the patient's preferred pharmacy:  [facility] [address], [place] - [address] [place] [address] Phone: [phone] Fax: [phone]   Is this the correct pharmacy for this prescription? Yes If no, delete pharmacy and type the correct one.   Has the prescription been filled recently? No  Is the patient out of the medication? Only has 2 pills left  Has the patient been seen for an appointment in the last year OR does the patient have an upcoming appointment? Yes  Can we respond through MyChart? Yes  Agent: Please be advised that Rx refills may take up to 3 business days. We ask that you follow-up with your pharmacy.

## 2023-09-10 ENCOUNTER — Telehealth: Admitting: Physician Assistant

## 2023-09-10 DIAGNOSIS — N3090 Cystitis, unspecified without hematuria: Secondary | ICD-10-CM | POA: Diagnosis not present

## 2023-09-10 MED ORDER — CEPHALEXIN 500 MG PO CAPS: 500.0000 mg | ORAL_CAPSULE | Freq: Two times a day (BID) | ORAL | 0 refills | Status: AC

## 2023-09-10 NOTE — Patient Instructions (Signed)
  Rogena MALVA Rogue, thank you for joining Lynden GORMAN Snuffer, PA-C for today's virtual visit.  While this provider is not your primary care provider (PCP), if your PCP is located in our provider database this encounter information will be shared with them immediately following your visit.   A [facility] MyChart account gives you access to today's visit and all your visits, tests, and labs performed at [facility]  click here if you don't have a [facility] MyChart account or go to [email or web address]  Consent: (Patient) Valerie Cole provided verbal consent for this virtual visit at the beginning of the encounter.  Current Medications:  Current Outpatient Medications:    amphetamine -dextroamphetamine (ADDERALL XR) 25 MG 24 hr capsule, Take 1 capsule by mouth every morning., Disp: 30 capsule, Rfl: 0   cetirizine (ZYRTEC) 10 MG tablet, Take 10 mg by mouth daily., Disp: , Rfl:    Cholecalciferol (VITAMIN D ) 50 MCG (2000 UT) tablet, Take 2,000 Units by mouth daily., Disp: , Rfl:    Medications ordered in this encounter:  No orders of the defined types were placed in this encounter.    *If you need refills on other medications prior to your next appointment, please contact your pharmacy*  Follow-Up: Call back or seek an in-person evaluation if the symptoms worsen or if the condition fails to improve as anticipated.  [facility] [phone]  Other Instructions You were given a prescription for antibiotics. Please take the antibiotic prescription fully.   Follow up with your regular doctor in 1 week for reassessment and seek care sooner if your symptoms worsen or fail to improve.   If you have been instructed to have an in-person evaluation today at a local Urgent Care facility, please use the link below. It will take you to a list of all of our available [facility], including address, phone number and hours of operation. Please do not delay care.   [facility]  If you or a family member do not have a primary care provider, use the link below to schedule a visit and establish care. When you choose a [facility] primary care physician or advanced practice provider, you gain a long-term partner in health. Find a Primary Care Provider  Learn more about [facility]'s in-office and virtual care options: [facility] - Get Care Now

## 2023-09-10 NOTE — Progress Notes (Signed)
 Valerie Cole are scheduled for a virtual visit with your provider today.    Just as we do with appointments in the office, we must obtain your consent to participate.  Your consent will be active for this visit and any virtual visit you may have with one of our providers in the next 365 days.    If you have a MyChart account, I can also send a copy of this consent to you electronically.  All virtual visits are billed to your insurance company just like a traditional visit in the office.  As this is a virtual visit, video technology does not allow for your provider to perform a traditional examination.  This may limit your provider's ability to fully assess your condition.  If your provider identifies any concerns that need to be evaluated in person or the need to arrange testing such as labs, EKG, etc, we will make arrangements to do so.    Although advances in technology are sophisticated, we cannot ensure that it will always work on either your end or our end.  If the connection with a video visit is poor, we may have to switch to a telephone visit.  With either a video or telephone visit, we are not always able to ensure that we have a secure connection.   I need to obtain your verbal consent now.   Are you willing to proceed with your visit today?   Valerie Cole has provided verbal consent on 09/10/2023 for a virtual visit (video or telephone).   Valerie Cole, [place] 09/10/2023  4:49 PM   Date:  09/10/2023   ID:  Valerie Cole, DOB 07-28-1970, MRN [id number]  Patient Location: Home Provider Location: Home Office   Participants: Patient and Provider for Visit and Wrap up  Method of visit: Video  Location of Patient: Home Location of Provider: Home Office Consent was obtain for visit over the video. Services rendered by provider: Visit was performed via video  A video enabled telemedicine application was used and I verified that I am speaking with the correct person using two  identifiers.  PCP:  Wendee Lynwood HERO, NP   Chief Complaint:  uti sxs  History of Present Illness:    Valerie Cole is a 53 y.o. female with history as stated below. Presents video telehealth for an acute care visit  Pt reports dysuria, malaise, frequency, urgency that started within the last 24 hours. Denies fever, vomiting, back pain.   Past Medical, Surgical, Social History, Allergies, and Medications have been Reviewed.  Past Medical History:  Diagnosis Date   Anxiety    Factor 5 Leiden mutation, heterozygous (HCC)     Current Meds  Medication Sig   cephALEXin  (KEFLEX ) 500 MG capsule Take 1 capsule (500 mg total) by mouth 2 (two) times daily for 7 days.     Allergies:   Amoxicillin, Focalin  xr [dexmethylphenidate  hcl er], and Demerol   ROS See HPI for history of present illness.  Physical Exam Constitutional:      Appearance: Normal appearance. She is not ill-appearing.  Neurological:     Mental Status: She is alert.         MDM: Pt with uti sxs. Hx same. Low suspicion for pyelonephritis. Rx for abx sent.   Tests Ordered: No orders of the defined types were placed in this encounter.   Medication Changes: Meds ordered this encounter  Medications   cephALEXin  (KEFLEX ) 500 MG capsule    Sig: Take  1 capsule (500 mg total) by mouth 2 (two) times daily for 7 days.    Dispense:  14 capsule    Refill:  0     Disposition:  Follow up  Signed, Valerie GORMAN Snuffer, PA-C  09/10/2023 4:49 PM

## 2023-09-25 ENCOUNTER — Other Ambulatory Visit: Payer: Self-pay | Admitting: Nurse Practitioner

## 2023-09-25 DIAGNOSIS — F909 Attention-deficit hyperactivity disorder, unspecified type: Secondary | ICD-10-CM

## 2023-09-25 MED ORDER — AMPHETAMINE-DEXTROAMPHET ER 25 MG PO CP24: 25.0000 mg | ORAL_CAPSULE | ORAL | 0 refills | Status: DC

## 2023-09-25 NOTE — Telephone Encounter (Signed)
 Copied from CRM [id number]. Topic: Clinical - Medication Refill >> Sep 25, 2023  8:10 AM Berneda F wrote: Medication: amphetamine -dextroamphetamine (ADDERALL XR) 25 MG 24 hr capsule  Has the patient contacted their pharmacy? Yes (Agent: If no, request that the patient contact the pharmacy for the refill. If patient does not wish to contact the pharmacy document the reason why and proceed with request.) (Agent: If yes, when and what did the pharmacy advise?)  This is the patient's preferred pharmacy:  [facility] [address], [place] - [address] [place] [address] Phone: [phone] Fax: [phone]  Is this the correct pharmacy for this prescription? Yes If no, delete pharmacy and type the correct one.   Has the prescription been filled recently? No  Is the patient out of the medication? No, has 2 more left  Has the patient been seen for an appointment in the last year OR does the patient have an upcoming appointment? Yes  Can we respond through MyChart? Yes  Agent: Please be advised that Rx refills may take up to 3 business days. We ask that you follow-up with your pharmacy.

## 2023-09-25 NOTE — Telephone Encounter (Signed)
 07/002/25 30 tabs/0 RF

## 2023-10-22 ENCOUNTER — Other Ambulatory Visit: Payer: Self-pay | Admitting: Nurse Practitioner

## 2023-10-22 DIAGNOSIS — F909 Attention-deficit hyperactivity disorder, unspecified type: Secondary | ICD-10-CM

## 2023-10-22 NOTE — Telephone Encounter (Unsigned)
 Copied from CRM [id number]. Topic: Clinical - Medication Refill >> Oct 22, 2023 12:33 PM Valerie Cole wrote: Medication: amphetamine -dextroamphetamine (ADDERALL XR) 25 MG 24 hr capsule    Has the patient contacted their pharmacy? Yes (Agent: If no, request that the patient contact the pharmacy for the refill. If patient does not wish to contact the pharmacy document the reason why and proceed with request.) (Agent: If yes, when and what did the pharmacy advise?) To contact Dr.  This is the patient's preferred pharmacy:  [facility] [address] [place] - [address] [place] [address] Phone: [phone] Fax: [phone]  Is this the correct pharmacy for this prescription? Yes If no, delete pharmacy and type the correct one.   Has the prescription been filled recently? Yes  Is the patient out of the medication? No, 4 days left  Has the patient been seen for an appointment in the last year OR does the patient have an upcoming appointment? Yes  Can we respond through MyChart? Yes  Agent: Please be advised that Rx refills may take up to 3 business days. We ask that you follow-up with your pharmacy.

## 2023-10-24 MED ORDER — AMPHETAMINE-DEXTROAMPHET ER 25 MG PO CP24: 25.0000 mg | ORAL_CAPSULE | ORAL | 0 refills | Status: DC

## 2023-11-15 IMAGING — CT CT RENAL STONE PROTOCOL
2 of 4 series · 16 of 46 positions shown, 18 images · non-contrast
Comparison: 04/09/2012 from [HOSPITAL]

CLINICAL DATA: Bilateral flank pain. Urinary frequency and dysuria.
Nephrolithiasis.



[Series 2: renal stone 5.00 · axial · 0.76mm/px · z∈[-1547,-1117]mm · 13 of 100 slices shown, 15 images]
[im 7/100  soft-tissue]
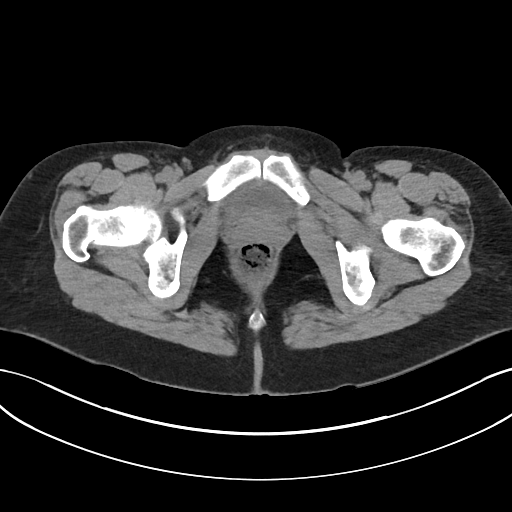
[im 7/100  bone]
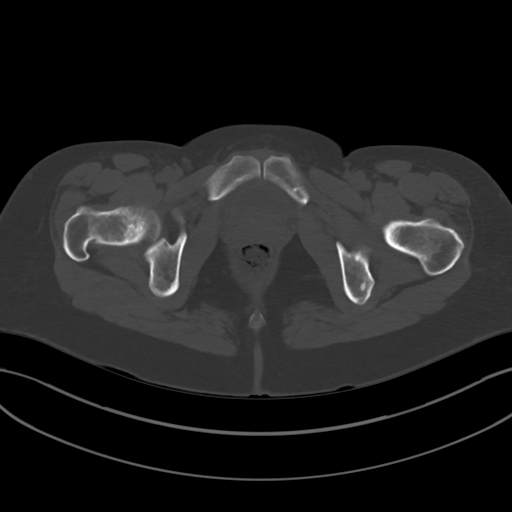
[im 14/100  soft-tissue]
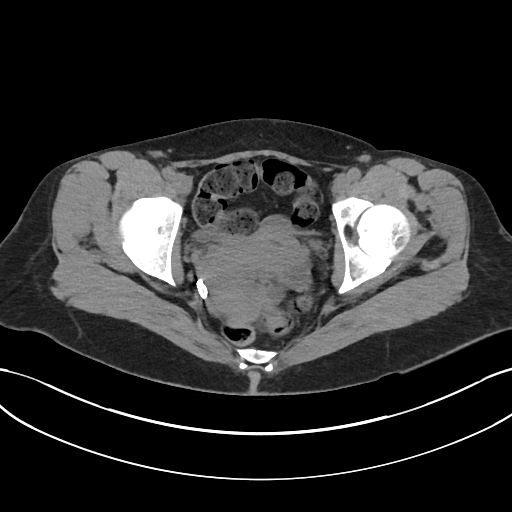
[im 20/100  soft-tissue]
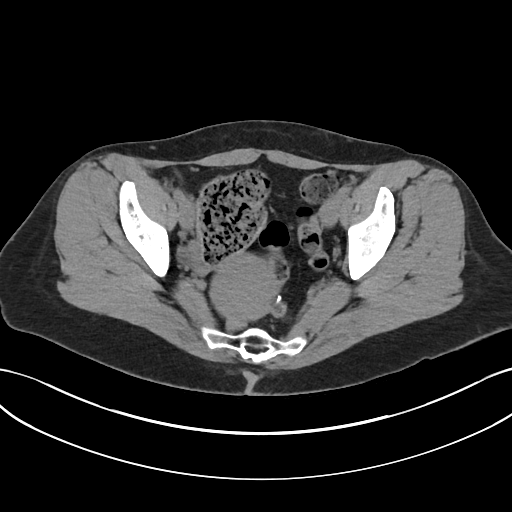
[im 27/100  soft-tissue]
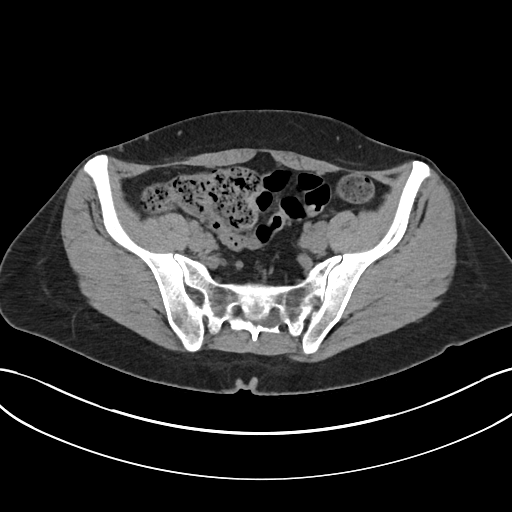
[im 34/100  soft-tissue]
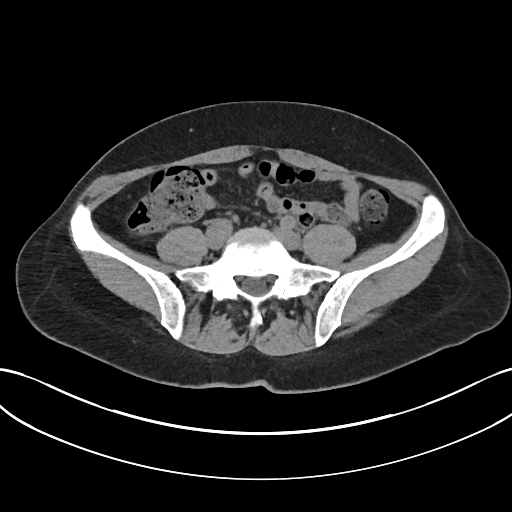
[im 40/100  soft-tissue]
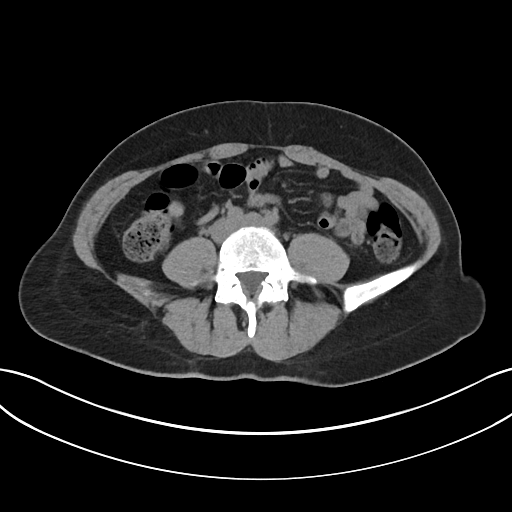
[im 53/100  soft-tissue]
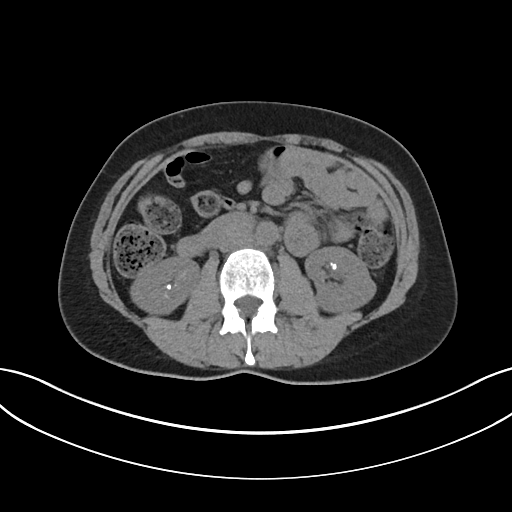
[im 60/100  soft-tissue]
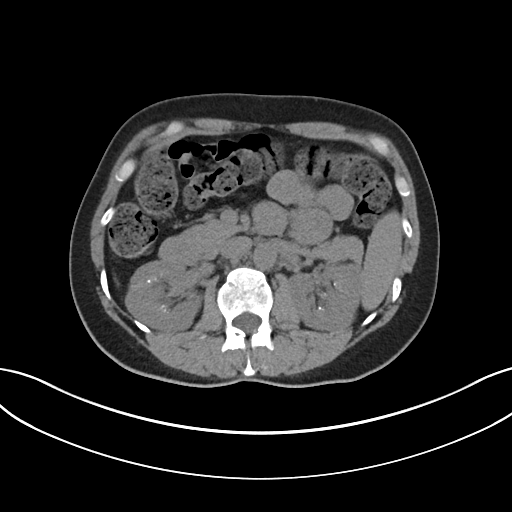
[im 67/100  soft-tissue]
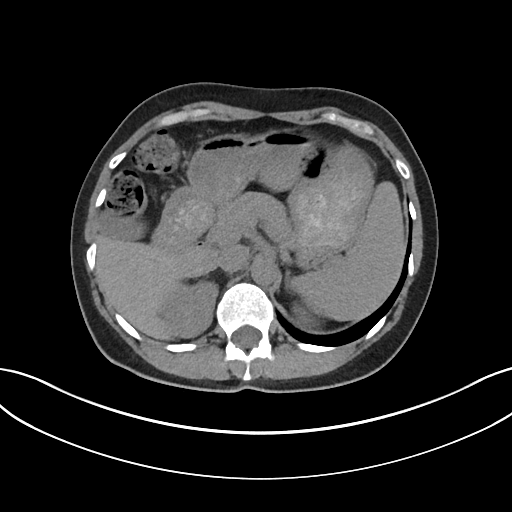
[im 67/100  bone]
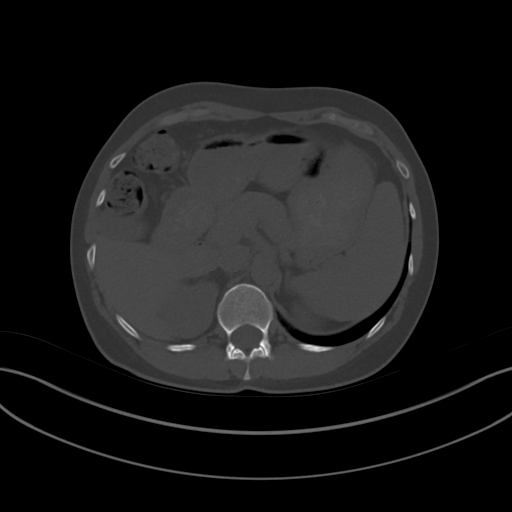
[im 73/100  soft-tissue]
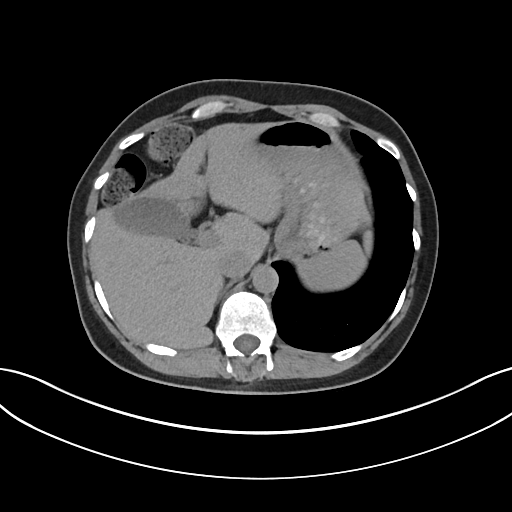
[im 80/100  soft-tissue]
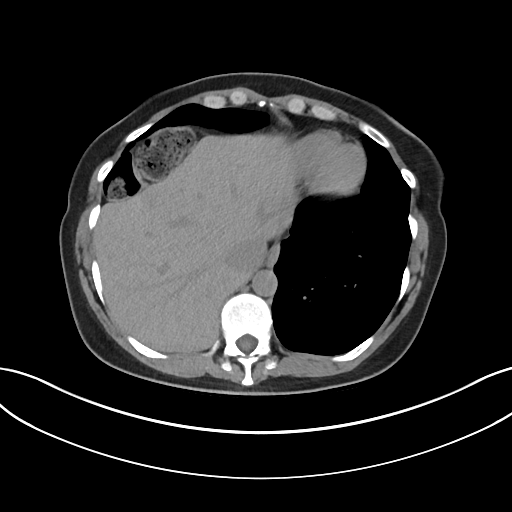
[im 86/100  soft-tissue]
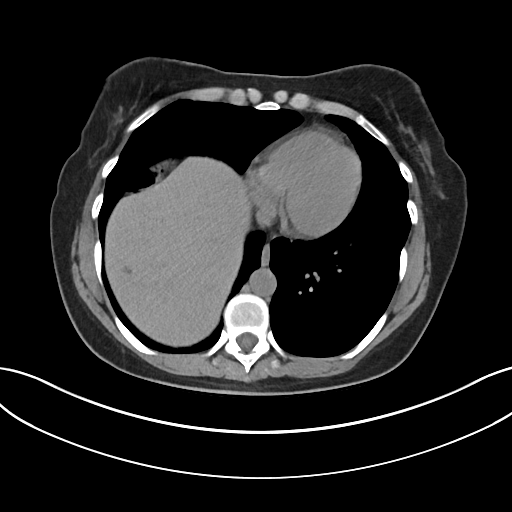
[im 93/100  soft-tissue]
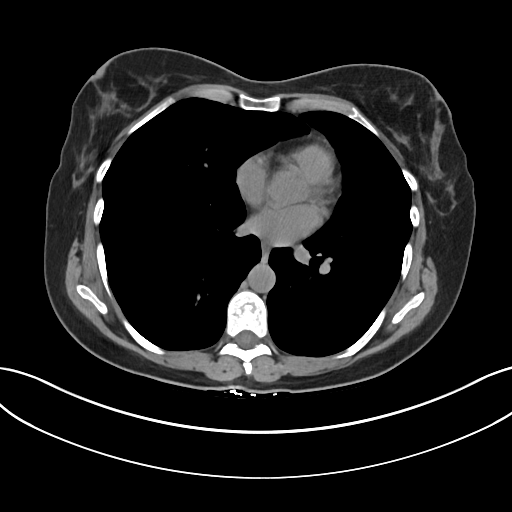

[Series 4: renal stone 2.00 cor · coronal · 0.76mm/px · 3 of 135 slices shown]
[im 45/135  soft-tissue]
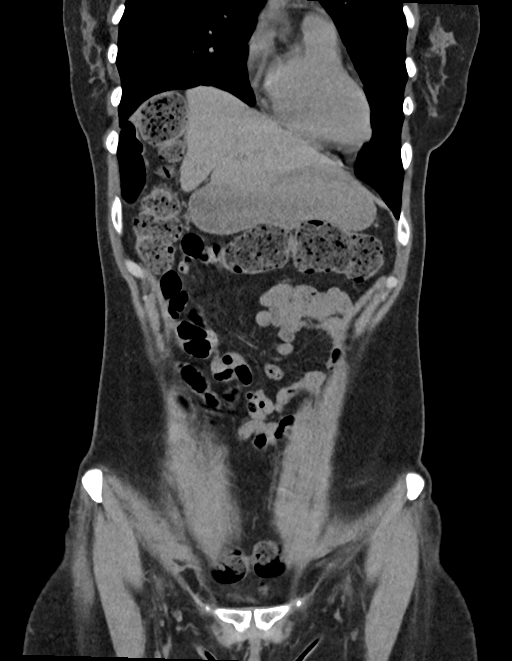
[im 60/135  soft-tissue]
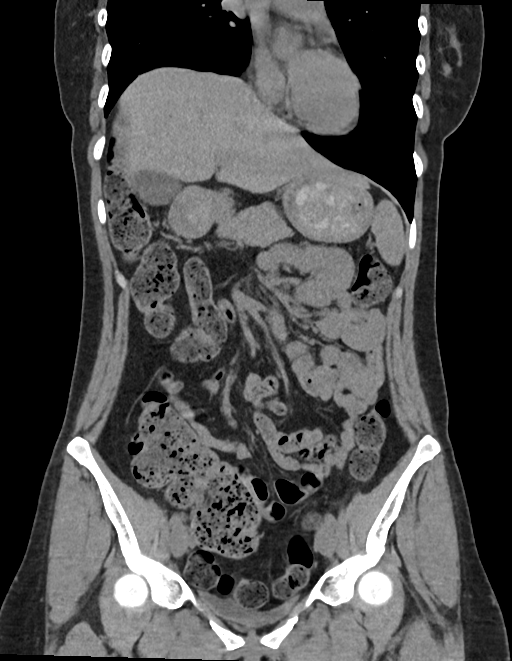
[im 75/135  soft-tissue]
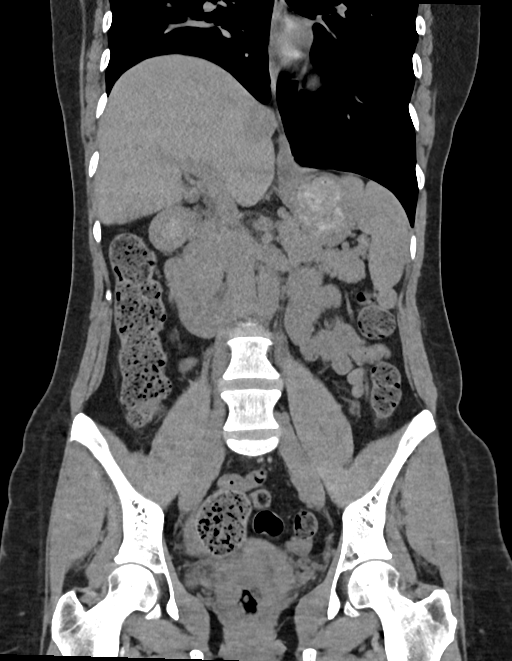

[16 of 46 positions shown; findings below may reference images not displayed]

FINDINGS: Lower chest: No acute findings. Stable marked elevation of right
hemidiaphragm.

Hepatobiliary: A few tiny sub-cm low-attenuation lesions are too
small to characterize but most likely represent tiny cysts. No
definite mass visualized on this unenhanced exam. Gallbladder is
unremarkable. No evidence of biliary ductal dilatation.

Pancreas: No mass or inflammatory process visualized on this
unenhanced exam.

Spleen:  Within normal limits in size.

Adrenals/Urinary tract: Multiple small less than 1 cm renal calculi
are again seen bilaterally. A 5 mm calculus is seen in the distal
left ureter near the left UVJ, however there is no significant
hydroureteronephrosis.

Stomach/Bowel: No evidence of obstruction, inflammatory process, or
abnormal fluid collections. Normal appendix visualized.

Vascular/Lymphatic: No pathologically enlarged lymph nodes
identified. No evidence of abdominal aortic aneurysm.

Reproductive: No mass or other significant abnormality. Clips are
seen from previous bilateral tubal ligation.

Other:  None.

Musculoskeletal:  No suspicious bone lesions identified.
IMPRESSION: 5 mm distal left ureteral calculus near the left UVJ, without
significant hydroureteronephrosis.

Bilateral nephrolithiasis.

## 2023-11-21 ENCOUNTER — Ambulatory Visit: Admitting: Nurse Practitioner

## 2023-11-21 VITALS — BP 102/62 | HR 95 | Temp 98.1°F | Ht 66.5 in | Wt 159.2 lb

## 2023-11-21 DIAGNOSIS — Z0000 Encounter for general adult medical examination without abnormal findings: Secondary | ICD-10-CM | POA: Diagnosis not present

## 2023-11-21 DIAGNOSIS — Z1231 Encounter for screening mammogram for malignant neoplasm of breast: Secondary | ICD-10-CM

## 2023-11-21 DIAGNOSIS — Z13220 Encounter for screening for lipoid disorders: Secondary | ICD-10-CM

## 2023-11-21 DIAGNOSIS — R7303 Prediabetes: Secondary | ICD-10-CM | POA: Diagnosis not present

## 2023-11-21 DIAGNOSIS — M779 Enthesopathy, unspecified: Secondary | ICD-10-CM

## 2023-11-21 DIAGNOSIS — F909 Attention-deficit hyperactivity disorder, unspecified type: Secondary | ICD-10-CM

## 2023-11-21 DIAGNOSIS — S39012A Strain of muscle, fascia and tendon of lower back, initial encounter: Secondary | ICD-10-CM | POA: Insufficient documentation

## 2023-11-21 DIAGNOSIS — Z1211 Encounter for screening for malignant neoplasm of colon: Secondary | ICD-10-CM

## 2023-11-21 DIAGNOSIS — E559 Vitamin D deficiency, unspecified: Secondary | ICD-10-CM | POA: Diagnosis not present

## 2023-11-21 DIAGNOSIS — E663 Overweight: Secondary | ICD-10-CM | POA: Diagnosis not present

## 2023-11-21 MED ORDER — AMPHETAMINE-DEXTROAMPHET ER 25 MG PO CP24: 25.0000 mg | ORAL_CAPSULE | ORAL | 0 refills | Status: DC

## 2023-11-21 NOTE — Progress Notes (Signed)
 Established Patient Office Visit  Subjective   Patient ID: Valerie Cole, female    DOB: 05-23-1970  Age: 53 y.o. MRN: [id number]  Chief Complaint  Patient presents with   Annual Exam    HPI  ADHD: Patient currently maintained on Adderall 25 mg XR daily  Allergies: Currently maintained on Zyrtec 10 mg daily  for complete physical and follow up of chronic conditions.  Immunizations: -Tetanus: Completed in unsure -Influenza:  refused -Shingles: refused -Pneumonia: refused   Diet: Fair diet. She is eating 3 meals a day and she will snakc sometimes. She will try to do healthy verisons.  Water  Exercise: She will exercise daily on a ski machine  Eye exam: Completes annually  Dental exam: Completes semi-annually    Colonoscopy: Referred but never completed. cologuard Lung Cancer Screening: N/A Pap smear: 01/25/2021, negative cells and negative HPV.  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 Then it started. Compression, offloading pain, and exercises from you tube. That has helped the most. 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  At the end of that she started having some pain in her lower back.      Review of Systems  Constitutional:  Negative for chills and fever.  Respiratory:  Negative for shortness of breath.   Cardiovascular:  Negative for chest pain and leg swelling.  Gastrointestinal:  Negative for abdominal pain, blood in stool, constipation, diarrhea, nausea and vomiting.       BM daily   Genitourinary:  Negative for dysuria and hematuria.  Neurological:  Negative for tingling and headaches.  Psychiatric/Behavioral:  Negative  for hallucinations and suicidal ideas.       Objective:     BP 102/62   Pulse 95   Temp 98.1 F (36.7 C) (Oral)   Ht 5' 6.5 (1.689 m)   Wt 159 lb 3.2 oz (72.2 kg)   SpO2 99%   BMI 25.31 kg/m  BP Readings from Last 3 Encounters:  11/21/23 102/62  05/21/23 112/62  03/15/23 119/82   Wt Readings from Last 3 Encounters:  11/21/23 159 lb 3.2 oz (72.2 kg)  05/21/23 153 lb 12.8 oz (69.8 kg)  03/15/23 150 lb (68 kg)   SpO2 Readings from Last 3 Encounters:  11/21/23 99%  05/21/23 96%  03/04/23 98%      Physical Exam Vitals and nursing note reviewed.  Constitutional:      Appearance: Normal appearance.  HENT:     Right Ear: Tympanic membrane, ear canal and external ear normal.     Left Ear: Tympanic membrane, ear canal and external ear normal.     Mouth/Throat:     Mouth: Mucous membranes are moist.     Pharynx: Oropharynx is clear.  Eyes:     Extraocular Movements: Extraocular movements intact.     Pupils: Pupils are equal, round, and reactive to light.  Cardiovascular:     Rate and Rhythm: Normal rate and regular rhythm.     Pulses: Normal pulses.     Heart sounds: Normal heart sounds.  Pulmonary:     Effort: Pulmonary effort is normal.  Breath sounds: Normal breath sounds.  Abdominal:     General: Bowel sounds are normal. There is no distension.     Palpations: There is no mass.     Tenderness: There is no abdominal tenderness.     Hernia: No hernia is present.  Musculoskeletal:     Lumbar back: Tenderness and bony tenderness present. Positive right straight leg raise test. Negative left straight leg raise test.       Back:     Right lower leg: No edema.     Left lower leg: No edema.  Lymphadenopathy:     Cervical: No cervical adenopathy.  Skin:    General: Skin is warm.  Neurological:     General: No focal deficit present.     Mental Status: She is alert.     Deep Tendon Reflexes:     Reflex Scores:      Bicep reflexes are 2+ on the right side and  2+ on the left side.      Patellar reflexes are 2+ on the right side and 2+ on the left side.    Comments: Bilateral upper and lower extremity strength 5/5  Psychiatric:        Mood and Affect: Mood normal.        Behavior: Behavior normal.        Thought Content: Thought content normal.        Judgment: Judgment normal.      No results found for any visits on 11/21/23.    The 10-year ASCVD risk score (Arnett DK, et al., 2019) is: 0.6%    Assessment & Plan:   Problem List Items Addressed This Visit       Musculoskeletal and Integument   Tendonitis   Bilateral tendinitis secondary to overuse.  Patient has tried compression sleeve and offloading band.  She can try anti-inflammatories and rest if no improvement follow-up sports medicine      Lumbar strain, initial encounter     Other   Adult ADHD   Currently maintained on Adderall 25 mg XR.  Update controlled substance agreement and urine drug screen today.  PDMP reviewed no suspicious activity refills provided      Relevant Medications   amphetamine -dextroamphetamine (ADDERALL XR) 25 MG 24 hr capsule   amphetamine -dextroamphetamine (ADDERALL XR) 25 MG 24 hr capsule   amphetamine -dextroamphetamine (ADDERALL XR) 25 MG 24 hr capsule   Other Relevant Orders   DRUG MONITORING, PANEL 8 WITH CONFIRMATION, URINE   Vitamin D  deficiency   History of the same pending vitamin D  level today      Relevant Orders   VITAMIN D  25 Hydroxy (Vit-D Deficiency, Fractures)   Preventative health care - Primary   Discussed age-appropriate immunizations and screening exams.  Did review patient's personal, surgical, social, family histories.  Patient is up-to-date on all age-appropriate vaccinations she would like.  Patient refused all vaccines in office today.  Cologuard ordered for CRC screening.  Mammogram ordered for breast cancer screening.  Patient is to follow-up with GYN as she is due for cervical cancer screening.  Patient was given  information at discharge about preventative healthcare maintenance with anticipatory guidance      Relevant Orders   CBC with Differential/Platelet   Comprehensive metabolic panel with GFR   TSH   Prediabetes   History of the same pending A1c      Relevant Orders   Hemoglobin A1c   Lipid panel   Overweight (BMI 25.0-29.9)   Pending A1c,  TSH, lipid panel.  Patient is exercising and eating a fairly decent diet.  Continue      Relevant Orders   Hemoglobin A1c   Lipid panel   Other Visit Diagnoses       Screening mammogram for breast cancer       Relevant Orders   MM 3D SCREENING MAMMOGRAM BILATERAL BREAST     Screening for colon cancer       Relevant Orders   Cologuard     Screening for lipid disorders       Relevant Orders   Lipid panel       Return in about 6 months (around 05/20/2024) for ADD?ADHD med recheck .    Adina Crandall, NP

## 2023-11-21 NOTE — Assessment & Plan Note (Signed)
 Bilateral tendinitis secondary to overuse.  Patient has tried compression sleeve and offloading band.  She can try anti-inflammatories and rest if no improvement follow-up sports medicine

## 2023-11-21 NOTE — Assessment & Plan Note (Signed)
 Currently maintained on Adderall 25 mg XR.  Update controlled substance agreement and urine drug screen today.  PDMP reviewed no suspicious activity refills provided

## 2023-11-21 NOTE — Assessment & Plan Note (Signed)
 Pending A1c, TSH, lipid panel.  Patient is exercising and eating a fairly decent diet.  Continue

## 2023-11-21 NOTE — Assessment & Plan Note (Signed)
History of the same pending vitamin D level today

## 2023-11-21 NOTE — Assessment & Plan Note (Signed)
History of the same pending A1c

## 2023-11-21 NOTE — Assessment & Plan Note (Signed)
 Discussed age-appropriate immunizations and screening exams.  Did review patient's personal, surgical, social, family histories.  Patient is up-to-date on all age-appropriate vaccinations she would like.  Patient refused all vaccines in office today.  Cologuard ordered for CRC screening.  Mammogram ordered for breast cancer screening.  Patient is to follow-up with GYN as she is due for cervical cancer screening.  Patient was given information at discharge about preventative healthcare maintenance with anticipatory guidance

## 2023-11-21 NOTE — Patient Instructions (Addendum)
 Nice to see you today You can use anti-inflammatories Follow up with me in 6 months, sooner if you need me   Please call and setup your mammogram at   [facility] [address] ( on hospital grounds) [place], [place]  [phone]

## 2023-11-22 LAB — COMPREHENSIVE METABOLIC PANEL WITH GFR
ALT: 12 U/L (ref 0–35)
AST: 18 U/L (ref 0–37)
Albumin: 4.5 g/dL (ref 3.5–5.2)
Alkaline Phosphatase: 59 U/L (ref 39–117)
BUN: 17 mg/dL (ref 6–23)
CO2: 29 meq/L (ref 19–32)
Calcium: 9.4 mg/dL (ref 8.4–10.5)
Chloride: 104 meq/L (ref 96–112)
Creatinine, Ser: 0.88 mg/dL (ref 0.40–1.20)
GFR: 76.27 mL/min (ref >60.00)
Glucose, Bld: 83 mg/dL (ref 70–99)
Potassium: 4 meq/L (ref 3.5–5.1)
Sodium: 139 meq/L (ref 135–145)
Total Bilirubin: 0.8 mg/dL (ref 0.2–1.2)
Total Protein: 7.1 g/dL (ref 6.0–8.3)

## 2023-11-22 LAB — CBC WITH DIFFERENTIAL/PLATELET
Basophils Absolute: 0.1 K/uL (ref 0.0–0.1)
Basophils Relative: 1.4 % (ref 0.0–3.0)
Eosinophils Absolute: 0.2 K/uL (ref 0.0–0.7)
Eosinophils Relative: 3.1 % (ref 0.0–5.0)
HCT: 42.7 % (ref 36.0–46.0)
Hemoglobin: 14.2 g/dL (ref 12.0–15.0)
Lymphocytes Relative: 32 % (ref 12.0–46.0)
Lymphs Abs: 2.4 K/uL (ref 0.7–4.0)
MCHC: 33.1 g/dL (ref 30.0–36.0)
MCV: 84.3 fl (ref 78.0–100.0)
Monocytes Absolute: 0.3 K/uL (ref 0.1–1.0)
Monocytes Relative: 4 % (ref 3.0–12.0)
Neutro Abs: 4.4 K/uL (ref 1.4–7.7)
Neutrophils Relative %: 59.5 % (ref 43.0–77.0)
Platelets: 252 K/uL (ref 150.0–400.0)
RBC: 5.07 Mil/uL (ref 3.87–5.11)
RDW: 13.8 % (ref 11.5–15.5)
WBC: 7.5 K/uL (ref 4.0–10.5)

## 2023-11-22 LAB — LIPID PANEL
Cholesterol: 182 mg/dL (ref 0–200)
HDL: 56.3 mg/dL (ref >39.00)
LDL Cholesterol: 117 mg/dL — ABNORMAL HIGH (ref 0–99)
NonHDL: 125.74
Total CHOL/HDL Ratio: 3
Triglycerides: 42 mg/dL (ref 0.0–149.0)
VLDL: 8.4 mg/dL (ref 0.0–40.0)

## 2023-11-22 LAB — VITAMIN D 25 HYDROXY (VIT D DEFICIENCY, FRACTURES): VITD: 31.49 ng/mL (ref 30.00–100.00)

## 2023-11-22 LAB — HEMOGLOBIN A1C: Hgb A1c MFr Bld: 5.7 % (ref 4.6–6.5)

## 2023-11-22 LAB — TSH: TSH: 1.11 u[IU]/mL (ref 0.35–5.50)

## 2023-11-23 LAB — DRUG MONITORING, PANEL 8 WITH CONFIRMATION, URINE
6 Acetylmorphine: NEGATIVE ng/mL (ref <10)
Alcohol Metabolites: NEGATIVE ng/mL (ref <500)
Amphetamine: 13310 ng/mL — ABNORMAL HIGH (ref <250)
Amphetamines: POSITIVE ng/mL — AB (ref <500)
Benzodiazepines: NEGATIVE ng/mL (ref <100)
Buprenorphine, Urine: NEGATIVE ng/mL (ref <5)
Cocaine Metabolite: NEGATIVE ng/mL (ref <150)
Creatinine: 176.9 mg/dL (ref >=20.0)
MDMA: NEGATIVE ng/mL (ref <500)
Marijuana Metabolite: NEGATIVE ng/mL (ref <20)
Methamphetamine: NEGATIVE ng/mL (ref <250)
Opiates: NEGATIVE ng/mL (ref <100)
Oxidant: NEGATIVE ug/mL (ref <200)
Oxycodone: NEGATIVE ng/mL (ref <100)
pH: 5.9 (ref 4.5–9.0)

## 2023-11-23 LAB — DM TEMPLATE

## 2023-11-25 ENCOUNTER — Ambulatory Visit: Payer: Self-pay | Admitting: Nurse Practitioner

## 2023-11-25 DIAGNOSIS — E7800 Pure hypercholesterolemia, unspecified: Secondary | ICD-10-CM | POA: Insufficient documentation

## 2023-11-28 ENCOUNTER — Encounter: Payer: Self-pay | Admitting: Nurse Practitioner

## 2023-12-01 IMAGING — CR DG ABDOMEN 1V
1 series · 2 of 2 positions shown · non-contrast
Comparison: CT abdomen/pelvis 06/27/2021, KUB 06/23/2021

CLINICAL DATA: Left ureteral stone

EXAM:
ABDOMEN - 1 VIEW

[Series 1: dg abd 1 view · 0.14mm/px · 2 of 2 slices shown]
[im 1/2]
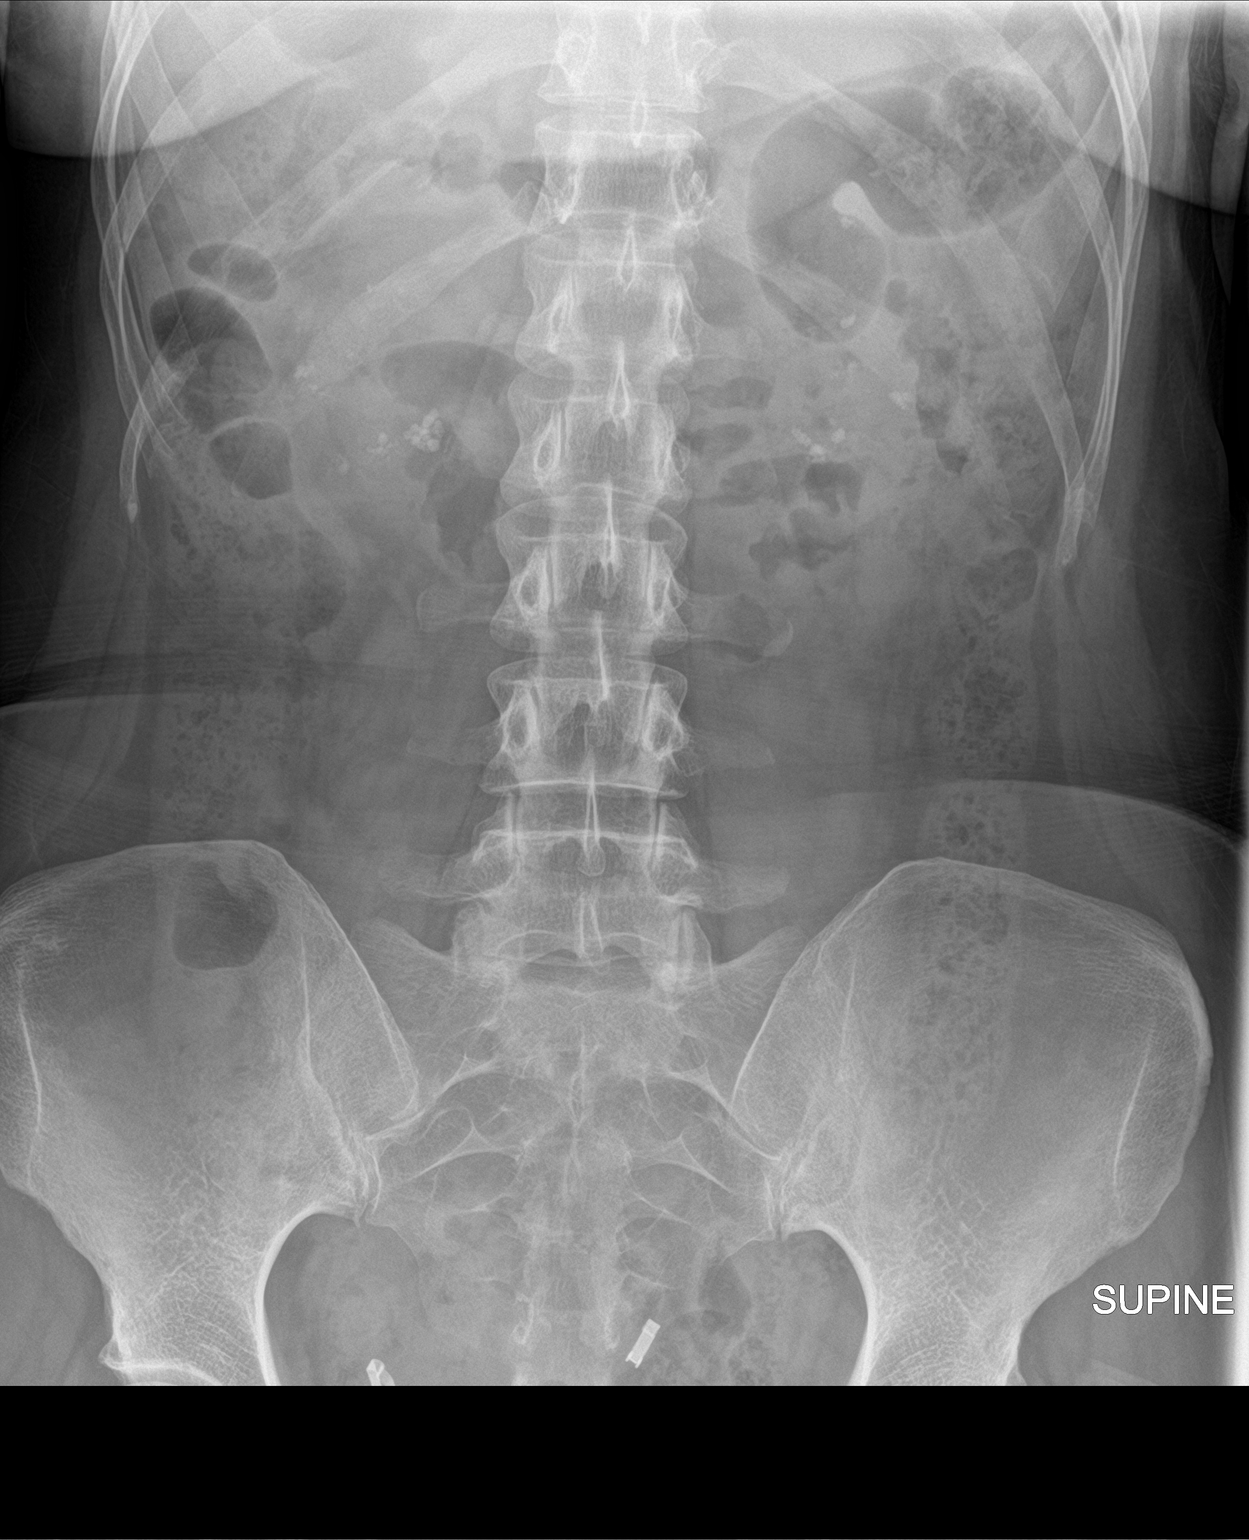
[im 2/2]
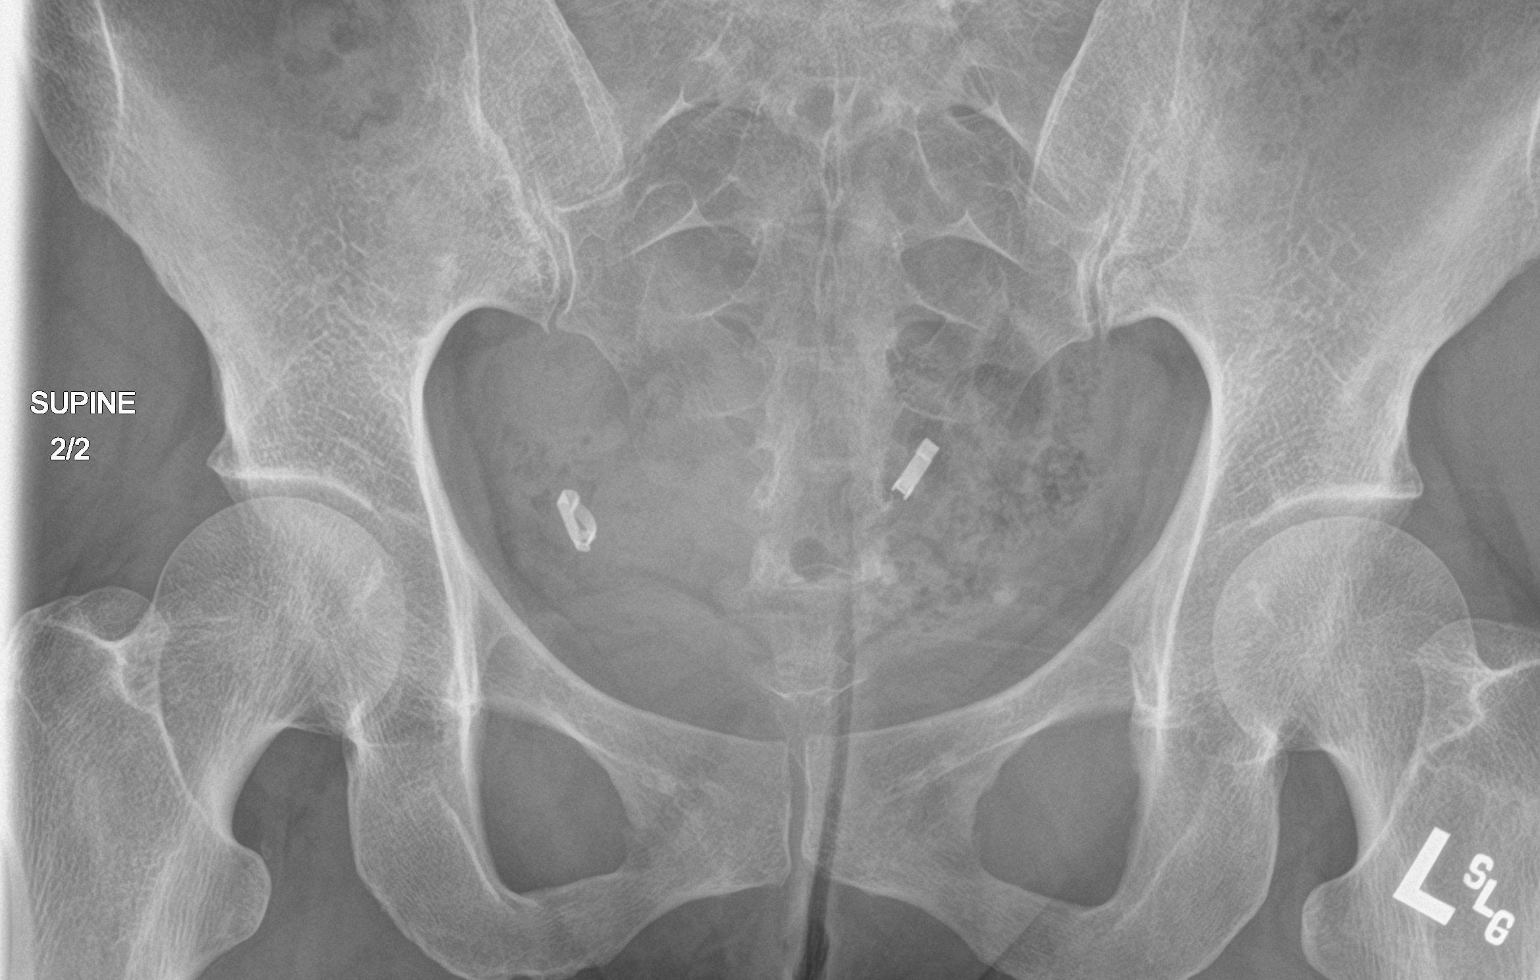

[2 of 2 positions shown; findings below may reference images not displayed]

FINDINGS: Multiple bilateral renal stones are seen, the largest individually
measuring up to 4 mm on the left and 5 mm on the right. There is
ill-defined calcification in the left pelvis measuring 4 mm which
could correspond to the distal left ureteral stone seen on the prior
CT, unchanged since the prior radiograph from 06/23/2021.

There is a nonobstructive bowel gas pattern. There is no acute
osseous abnormality. Tubal ligation clips are again noted.
IMPRESSION: 1. Calcification in the left pelvis may correspond to the distal
left ureteral stone seen on the prior CT, unchanged since the prior
radiograph from 06/23/2021.
[DATE]. Additional bilateral renal stones as above.

## 2023-12-11 DIAGNOSIS — Z1211 Encounter for screening for malignant neoplasm of colon: Secondary | ICD-10-CM | POA: Diagnosis not present

## 2023-12-20 LAB — COLOGUARD: COLOGUARD: NEGATIVE

## 2024-02-14 ENCOUNTER — Telehealth: Payer: Self-pay | Admitting: Nurse Practitioner

## 2024-02-14 ENCOUNTER — Other Ambulatory Visit: Payer: Self-pay | Admitting: Nurse Practitioner

## 2024-02-14 DIAGNOSIS — F909 Attention-deficit hyperactivity disorder, unspecified type: Secondary | ICD-10-CM

## 2024-02-14 MED ORDER — AMPHETAMINE-DEXTROAMPHET ER 25 MG PO CP24: 25.0000 mg | ORAL_CAPSULE | ORAL | 0 refills | Status: DC

## 2024-02-14 NOTE — Telephone Encounter (Unsigned)
 Copied from CRM [id number]. Topic: Clinical - Medication Refill >> Feb 14, 2024  9:36 AM Joesph B wrote: Medication:  amphetamine -dextroamphetamine (ADDERALL XR) 25 MG 24 hr capsule.    Has the patient contacted their pharmacy? Yes (Agent: If no, request that the patient contact the pharmacy for the refill. If patient does not wish to contact the pharmacy document the reason why and proceed with request.) (Agent: If yes, when and what did the pharmacy advise?)  This is the patient's preferred pharmacy:  [facility] [address], [place] - [address] [place] [address] Phone: [phone] Fax: [phone]  Is this the correct pharmacy for this prescription? Yes If no, delete pharmacy and type the correct one.   Has the prescription been filled recently? Yes  Is the patient out of the medication? 3 or 4 days left   Has the patient been seen for an appointment in the last year OR does the patient have an upcoming appointment? Yes  Can we respond through MyChart? Yes  Agent: Please be advised that Rx refills may take up to 3 business days. We ask that you follow-up with your pharmacy.

## 2024-02-14 NOTE — Telephone Encounter (Signed)
 Looks like this has been sent in by [employer]

## 2024-02-14 NOTE — Telephone Encounter (Unsigned)
 Copied from CRM [id number]. Topic: Clinical - Medication Refill >> Feb 14, 2024  9:36 AM Joesph B wrote: Medication:  amphetamine -dextroamphetamine (ADDERALL XR) 25 MG 24 hr capsule.    Has the patient contacted their pharmacy? Yes (Agent: If no, request that the patient contact the pharmacy for the refill. If patient does not wish to contact the pharmacy document the reason why and proceed with request.) (Agent: If yes, when and what did the pharmacy advise?)  This is the patient's preferred pharmacy:  [facility] [address], [place] - [address] [place] [address] Phone: [phone] Fax: [phone]  Is this the correct pharmacy for this prescription? Yes If no, delete pharmacy and type the correct one.   Has the prescription been filled recently? Yes  Is the patient out of the medication? 3 or 4 days left   Has the patient been seen for an appointment in the last year OR does the patient have an upcoming appointment? Yes  Can we respond through MyChart? Yes  Agent: Please be advised that Rx refills may take up to 3 business days. We ask that you follow-up with your pharmacy. >> Feb 14, 2024  9:43 AM Joesph B wrote: Patient would like to come to the office and pick up her prescription. She will be out of ins for 90 days and would like to see which pharmacy is cheaper with [facility]. Please fu with patient if this is possible. IF not possible, please send it to [facility] - [address] [place], [place], [place] [address]. She changed her mind on the location after I submitted the medication refill.

## 2024-03-18 ENCOUNTER — Telehealth: Payer: Self-pay

## 2024-03-18 DIAGNOSIS — F909 Attention-deficit hyperactivity disorder, unspecified type: Secondary | ICD-10-CM

## 2024-03-18 NOTE — Telephone Encounter (Signed)
 Copied from CRM #[id number]. Topic: Clinical - Medication Refill >> Mar 18, 2024 11:58 AM Kevelyn M wrote: Medication: amphetamine -dextroamphetamine (ADDERALL XR) 25  Has the patient contacted their pharmacy? Yes (Agent: If no, request that the patient contact the pharmacy for the refill. If patient does not wish to contact the pharmacy document the reason why and proceed with request.) (Agent: If yes, when and what did the pharmacy advise?)  This is the patient's preferred pharmacy:  [facility] [address] [place] - [address] [place] [address] Phone: [phone] Fax: [phone]  Is this the correct pharmacy for this prescription? Yes If no, delete pharmacy and type the correct one.   Has the prescription been filled recently? No  Is the patient out of the medication? No  Has the patient been seen for an appointment in the last year OR does the patient have an upcoming appointment? Yes  Can we respond through MyChart? Yes  Agent: Please be advised that Rx refills may take up to 3 business days. We ask that you follow-up with your pharmacy.

## 2024-03-18 NOTE — Telephone Encounter (Signed)
 Requesting: Adderall  Contract: Yes 05/09/21 UDS: n/a  Last Visit: Visit date not found Next Visit: Visit date not found Last Refill: 02/14/24  Please Advise

## 2024-03-19 MED ORDER — AMPHETAMINE-DEXTROAMPHET ER 25 MG PO CP24: 25.0000 mg | ORAL_CAPSULE | ORAL | 0 refills | Status: AC

## 2024-03-19 NOTE — Telephone Encounter (Signed)
 Scripts sent in

## 2024-03-19 NOTE — Telephone Encounter (Signed)
 Left detailed voicemail for patient to call the office back if any questions or concerns.  Left mychart message relaying information.

## 2024-03-19 NOTE — Addendum Note (Signed)
 Addended by: WENDEE LYNWOOD HERO on: 03/19/2024 01:15 PM   Modules accepted: Orders

## 2024-05-21 ENCOUNTER — Ambulatory Visit: Admitting: Nurse Practitioner
